# Patient Record
Sex: Male | Born: 1970 | Race: White | Hispanic: No | Marital: Married | Smoking: Former smoker
Health system: Southern US, Community
[De-identification: ages and names within clinical notes are randomized; demographics above are authoritative.]

## PROBLEM LIST (undated history)

## (undated) DIAGNOSIS — K219 Gastro-esophageal reflux disease without esophagitis: Secondary | ICD-10-CM

## (undated) DIAGNOSIS — S83512A Sprain of anterior cruciate ligament of left knee, initial encounter: Secondary | ICD-10-CM

## (undated) DIAGNOSIS — T7840XA Allergy, unspecified, initial encounter: Secondary | ICD-10-CM

## (undated) DIAGNOSIS — E785 Hyperlipidemia, unspecified: Secondary | ICD-10-CM

## (undated) HISTORY — DX: Gastro-esophageal reflux disease without esophagitis: K21.9

## (undated) HISTORY — DX: Allergy, unspecified, initial encounter: T78.40XA

## (undated) HISTORY — DX: Hyperlipidemia, unspecified: E78.5

## (undated) HISTORY — PX: WISDOM TOOTH EXTRACTION: SHX21

## (undated) HISTORY — PX: ARTHROSCOPIC REPAIR ACL: SUR80

## (undated) HISTORY — DX: Sprain of anterior cruciate ligament of left knee, initial encounter: S83.512A

---

## 2015-10-04 DIAGNOSIS — S83512D Sprain of anterior cruciate ligament of left knee, subsequent encounter: Secondary | ICD-10-CM | POA: Diagnosis not present

## 2015-10-04 DIAGNOSIS — Z4789 Encounter for other orthopedic aftercare: Secondary | ICD-10-CM | POA: Diagnosis not present

## 2015-10-13 DIAGNOSIS — S83512D Sprain of anterior cruciate ligament of left knee, subsequent encounter: Secondary | ICD-10-CM | POA: Diagnosis not present

## 2015-10-15 DIAGNOSIS — S83512D Sprain of anterior cruciate ligament of left knee, subsequent encounter: Secondary | ICD-10-CM | POA: Diagnosis not present

## 2015-10-19 DIAGNOSIS — S83512D Sprain of anterior cruciate ligament of left knee, subsequent encounter: Secondary | ICD-10-CM | POA: Diagnosis not present

## 2015-10-21 DIAGNOSIS — S83512D Sprain of anterior cruciate ligament of left knee, subsequent encounter: Secondary | ICD-10-CM | POA: Diagnosis not present

## 2015-10-28 DIAGNOSIS — S83512D Sprain of anterior cruciate ligament of left knee, subsequent encounter: Secondary | ICD-10-CM | POA: Diagnosis not present

## 2015-11-01 DIAGNOSIS — S83512D Sprain of anterior cruciate ligament of left knee, subsequent encounter: Secondary | ICD-10-CM | POA: Diagnosis not present

## 2015-11-03 DIAGNOSIS — S83512D Sprain of anterior cruciate ligament of left knee, subsequent encounter: Secondary | ICD-10-CM | POA: Diagnosis not present

## 2015-11-04 ENCOUNTER — Ambulatory Visit (INDEPENDENT_AMBULATORY_CARE_PROVIDER_SITE_OTHER): Payer: BLUE CROSS/BLUE SHIELD | Admitting: Urgent Care

## 2015-11-04 VITALS — BP 122/78 | HR 64 | Temp 98.0°F | Resp 18 | Wt 203.0 lb

## 2015-11-04 DIAGNOSIS — Z23 Encounter for immunization: Secondary | ICD-10-CM

## 2015-11-04 DIAGNOSIS — Z9889 Other specified postprocedural states: Secondary | ICD-10-CM | POA: Diagnosis not present

## 2015-11-04 DIAGNOSIS — Z0000 Encounter for general adult medical examination without abnormal findings: Secondary | ICD-10-CM | POA: Diagnosis not present

## 2015-11-04 LAB — COMPREHENSIVE METABOLIC PANEL
ALK PHOS: 76 U/L (ref 40–115)
ALT: 22 U/L (ref 9–46)
AST: 17 U/L (ref 10–40)
Albumin: 4.2 g/dL (ref 3.6–5.1)
BILIRUBIN TOTAL: 0.7 mg/dL (ref 0.2–1.2)
BUN: 14 mg/dL (ref 7–25)
CO2: 28 mmol/L (ref 20–31)
CREATININE: 0.91 mg/dL (ref 0.60–1.35)
Calcium: 9.2 mg/dL (ref 8.6–10.3)
Chloride: 106 mmol/L (ref 98–110)
Glucose, Bld: 100 mg/dL — ABNORMAL HIGH (ref 65–99)
POTASSIUM: 3.9 mmol/L (ref 3.5–5.3)
SODIUM: 141 mmol/L (ref 135–146)
TOTAL PROTEIN: 7 g/dL (ref 6.1–8.1)

## 2015-11-04 LAB — CBC
HCT: 46.4 % (ref 38.5–50.0)
Hemoglobin: 16.2 g/dL (ref 13.2–17.1)
MCH: 31.3 pg (ref 27.0–33.0)
MCHC: 34.9 g/dL (ref 32.0–36.0)
MCV: 89.6 fL (ref 80.0–100.0)
MPV: 10.5 fL (ref 7.5–12.5)
PLATELETS: 242 10*3/uL (ref 140–400)
RBC: 5.18 MIL/uL (ref 4.20–5.80)
RDW: 14.1 % (ref 11.0–15.0)
WBC: 6.9 10*3/uL (ref 3.8–10.8)

## 2015-11-04 LAB — LIPID PANEL
CHOLESTEROL: 205 mg/dL — AB (ref 125–200)
HDL: 65 mg/dL (ref >=40)
LDL Cholesterol: 125 mg/dL (ref <130)
Total CHOL/HDL Ratio: 3.2 Ratio (ref <=5.0)
Triglycerides: 73 mg/dL (ref <150)
VLDL: 15 mg/dL (ref <30)

## 2015-11-04 LAB — TSH: TSH: 1.16 m[IU]/L (ref 0.40–4.50)

## 2015-11-04 NOTE — Progress Notes (Signed)
MRN: [id number]  Subjective:   [name] is a 45 y.o. male presenting for annual physical exam.  Medical care team includes: PCP: No PCP Per Patient Vision: Is currently awaiting his new prescription glasses. Dental: Dental cleanings yearly. Specialists: Physical therapy and Dr. Theda Sers at [facility] for left ACL reconstruction. Sees a dermatologist regularly for monitoring of moles.    Patient is married, does not have any children. 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 Denies smoking cigarettes, has 1 drink of alcohol with dinner daily.   Alejandro Miller does not have any active problems on his problem list.   Alejandro Miller currently has no medications in their medication list. He has No Known Allergies.  Alejandro Miller  has a past medical history of Left ACL tear and GERD (gastroesophageal reflux disease). Also  has past surgical history that includes Arthroscopic repair ACL.  His family history includes Cancer in his maternal grandfather; Heart disease in his father and paternal grandfather; Hypertension in his father.  Immunizations: Last TDAP was in 2004.  Review of Systems  Constitutional: Negative for fever, chills, weight loss, malaise/fatigue and diaphoresis.  HENT: Negative for congestion, ear discharge, ear pain, hearing loss, nosebleeds, sore throat and tinnitus.   Eyes: Negative for blurred vision, double vision, photophobia, pain, discharge and redness.  Respiratory: Negative for cough, shortness of breath and wheezing.   Cardiovascular: Negative for chest pain, palpitations and leg swelling.  Gastrointestinal: Negative for nausea, vomiting, abdominal pain, diarrhea, constipation and blood in stool.  Genitourinary: Negative for dysuria, urgency, frequency, hematuria and flank pain.  Musculoskeletal: Negative for myalgias, back pain and joint pain.  Skin: Negative for itching and rash.  Neurological: Negative for dizziness, tingling,  seizures, loss of consciousness, weakness and headaches.  Endo/Heme/Allergies: Negative for polydipsia.  Psychiatric/Behavioral: Negative for depression, suicidal ideas, hallucinations, memory loss and substance abuse. The patient is not nervous/anxious and does not have insomnia.      Objective:   Vitals: BP 122/78 mmHg  Pulse 64  Temp(Src) 98 F (36.7 C) (Oral)  Resp 18  Wt 203 lb (92.08 kg)  SpO2 97%  Physical Exam  Constitutional: He is oriented to person, place, and time. He appears well-developed and well-nourished.  HENT:  TM's intact bilaterally, no effusions or erythema. Nasal turbinates pink and moist, nasal passages patent. No sinus tenderness. Oropharynx clear, mucous membranes moist, dentition in good repair.  Eyes: Conjunctivae and EOM are normal. Pupils are equal, round, and reactive to light. Right eye exhibits no discharge. Left eye exhibits no discharge. No scleral icterus.  Neck: Normal range of motion. Neck supple. No thyromegaly present.  Cardiovascular: Normal rate, regular rhythm and intact distal pulses.  Exam reveals no gallop and no friction rub.   No murmur heard. Pulmonary/Chest: No stridor. No respiratory distress. He has no wheezes. He has no rales.  Abdominal: Soft. Bowel sounds are normal. He exhibits no distension and no mass. There is no tenderness.  Musculoskeletal: Normal range of motion. He exhibits no edema or tenderness.       Legs: Lymphadenopathy:    He has no cervical adenopathy.  Neurological: He is alert and oriented to person, place, and time. He has normal reflexes.  Skin: Skin is warm and dry. No rash noted. No erythema. No pallor.  Psychiatric: He has a normal mood and affect.   Assessment and Plan :   1. Annual physical exam - Patient is medically healthy, labs pending. Discussed healthy lifestyle,  diet, exercise, preventative care, vaccinations, and addressed patient's concerns.   2. Need for Tdap vaccination - Tdap vaccine  greater than or equal to 7yo IM  3. S/P ACL repair - Stable, continue follow up with [place], [facility] [phone] 11/04/2015  9:56 AM

## 2015-11-04 NOTE — Patient Instructions (Addendum)
Keeping you healthy  Get these tests  Blood pressure- Have your blood pressure checked once a year by your healthcare provider.  Normal blood pressure is 120/80.  Weight- Have your body mass index (BMI) calculated to screen for obesity.  BMI is a measure of body fat based on height and weight. You can also calculate your own BMI at GravelBags.it.  Cholesterol- Have your cholesterol checked regularly starting at age 46, sooner may be necessary if you have diabetes, high blood pressure, if a family member developed heart diseases at an early age or if you smoke.   Chlamydia, HIV, and other sexual transmitted disease- Get screened each year until the age of 67 then within three months of each new sexual partner.  Diabetes- Have your blood sugar checked regularly if you have high blood pressure, high cholesterol, a family history of diabetes or if you are overweight.  Get these vaccines  Flu shot- Every fall.  Tetanus shot- Every 10 years.  Menactra- Single dose; prevents meningitis.  Take these steps  Don't smoke- If you do smoke, ask your healthcare provider about quitting. For tips on how to quit, go to www.smokefree.gov or call 1-800-QUIT-NOW.  Be physically active- Exercise 5 days a week for at least 30 minutes.  If you are not already physically active start slow and gradually work up to 30 minutes of moderate physical activity.  Examples of moderate activity include walking briskly, mowing the yard, dancing, swimming bicycling, etc.  Eat a healthy diet- Eat a variety of healthy foods such as fruits, vegetables, low fat milk, low fat cheese, yogurt, lean meats, poultry, fish, beans, tofu, etc.  For more information on healthy eating, go to www.thenutritionsource.org  Drink alcohol in moderation- Limit alcohol intake two drinks or less a day.  Never drink and drive.  Dentist- Brush and floss teeth twice daily; visit your dentis twice a year.  Depression-Your emotional  health is as important as your physical health.  If you're feeling down, losing interest in things you normally enjoy please talk with your healthcare provider.  Gun Safety- If you keep a gun in your home, keep it unloaded and with the safety lock on.  Bullets should be stored separately.  Helmet use- Always wear a helmet when riding a motorcycle, bicycle, rollerblading or skateboarding.  Safe sex- If you may be exposed to a sexually transmitted infection, use a condom  Seat belts- Seat bels can save your life; always wear one.  Smoke/Carbon Monoxide detectors- These detectors need to be installed on the appropriate level of your home.  Replace batteries at least once a year.  Skin Cancer- When out in the sun, cover up and use sunscreen SPF 15 or higher.  Violence- If anyone is threatening or hurting you, please tell your healthcare provider.    IF you received an x-ray today, you will receive an invoice from [facility]. Please contact [facility] at [phone] with questions or concerns regarding your invoice.   IF you received labwork today, you will receive an invoice from [employer]. Please contact [facility] at [phone] with questions or concerns regarding your invoice.   Our billing staff will not be able to assist you with questions regarding bills from these companies.  You will be contacted with the lab results as soon as they are available. The fastest way to get your results is to activate your My Chart account. Instructions are located on the last page of this paperwork. If you have  not heard from [place] regarding the results in 2 weeks, please contact this office.

## 2015-11-05 DIAGNOSIS — S83512D Sprain of anterior cruciate ligament of left knee, subsequent encounter: Secondary | ICD-10-CM | POA: Diagnosis not present

## 2015-11-09 ENCOUNTER — Encounter: Payer: Self-pay | Admitting: Urgent Care

## 2015-11-22 DIAGNOSIS — M1712 Unilateral primary osteoarthritis, left knee: Secondary | ICD-10-CM | POA: Diagnosis not present

## 2015-11-22 DIAGNOSIS — S83512D Sprain of anterior cruciate ligament of left knee, subsequent encounter: Secondary | ICD-10-CM | POA: Diagnosis not present

## 2015-11-23 DIAGNOSIS — S83512D Sprain of anterior cruciate ligament of left knee, subsequent encounter: Secondary | ICD-10-CM | POA: Diagnosis not present

## 2015-11-25 DIAGNOSIS — S83512D Sprain of anterior cruciate ligament of left knee, subsequent encounter: Secondary | ICD-10-CM | POA: Diagnosis not present

## 2015-11-30 DIAGNOSIS — S83512D Sprain of anterior cruciate ligament of left knee, subsequent encounter: Secondary | ICD-10-CM | POA: Diagnosis not present

## 2015-12-02 DIAGNOSIS — S83512D Sprain of anterior cruciate ligament of left knee, subsequent encounter: Secondary | ICD-10-CM | POA: Diagnosis not present

## 2016-01-04 DIAGNOSIS — M1712 Unilateral primary osteoarthritis, left knee: Secondary | ICD-10-CM | POA: Diagnosis not present

## 2016-01-04 DIAGNOSIS — S83512D Sprain of anterior cruciate ligament of left knee, subsequent encounter: Secondary | ICD-10-CM | POA: Diagnosis not present

## 2016-01-04 DIAGNOSIS — Z4789 Encounter for other orthopedic aftercare: Secondary | ICD-10-CM | POA: Diagnosis not present

## 2016-01-19 DIAGNOSIS — S83512D Sprain of anterior cruciate ligament of left knee, subsequent encounter: Secondary | ICD-10-CM | POA: Diagnosis not present

## 2016-01-24 DIAGNOSIS — S83512D Sprain of anterior cruciate ligament of left knee, subsequent encounter: Secondary | ICD-10-CM | POA: Diagnosis not present

## 2016-01-24 DIAGNOSIS — Z4789 Encounter for other orthopedic aftercare: Secondary | ICD-10-CM | POA: Diagnosis not present

## 2016-01-24 DIAGNOSIS — M25862 Other specified joint disorders, left knee: Secondary | ICD-10-CM | POA: Diagnosis not present

## 2016-01-24 DIAGNOSIS — M1712 Unilateral primary osteoarthritis, left knee: Secondary | ICD-10-CM | POA: Diagnosis not present

## 2016-02-03 DIAGNOSIS — M94262 Chondromalacia, left knee: Secondary | ICD-10-CM | POA: Diagnosis not present

## 2016-02-03 DIAGNOSIS — S83242A Other tear of medial meniscus, current injury, left knee, initial encounter: Secondary | ICD-10-CM | POA: Diagnosis not present

## 2016-02-03 DIAGNOSIS — M1712 Unilateral primary osteoarthritis, left knee: Secondary | ICD-10-CM | POA: Diagnosis not present

## 2016-02-03 DIAGNOSIS — S83232A Complex tear of medial meniscus, current injury, left knee, initial encounter: Secondary | ICD-10-CM | POA: Diagnosis not present

## 2016-03-09 ENCOUNTER — Ambulatory Visit (INDEPENDENT_AMBULATORY_CARE_PROVIDER_SITE_OTHER): Payer: BLUE CROSS/BLUE SHIELD | Admitting: Family Medicine

## 2016-03-09 VITALS — BP 140/88 | HR 68 | Temp 98.2°F | Resp 18 | Wt 207.8 lb

## 2016-03-09 DIAGNOSIS — R300 Dysuria: Secondary | ICD-10-CM | POA: Diagnosis not present

## 2016-03-09 LAB — POCT URINALYSIS DIP (MANUAL ENTRY)
Bilirubin, UA: NEGATIVE
GLUCOSE UA: NEGATIVE
Ketones, POC UA: NEGATIVE
LEUKOCYTES UA: NEGATIVE
NITRITE UA: NEGATIVE
Protein Ur, POC: NEGATIVE
SPEC GRAV UA: 1.025
UROBILINOGEN UA: 0.2
pH, UA: 6

## 2016-03-09 LAB — POC MICROSCOPIC URINALYSIS (UMFC): MUCUS RE: ABSENT

## 2016-03-09 MED ORDER — CIPROFLOXACIN HCL 500 MG PO TABS: 500.0000 mg | ORAL_TABLET | Freq: Two times a day (BID) | ORAL | 0 refills | Status: DC

## 2016-03-09 MED ORDER — HYDROCORTISONE 2.5 % RE CREA: 1.0000 "application " | TOPICAL_CREAM | Freq: Two times a day (BID) | RECTAL | 0 refills | Status: DC

## 2016-03-09 NOTE — Progress Notes (Signed)
   Alejandro Miller is a 45 y.o. male who presents to [facility] today for urinary symptoms:  1.  LUTS:  77 rolled male with symptoms of nocturia, and suprapubic pain, difficulty stopping urine stream for past 10 days. Denies any fevers or chills. Denies any concern for STDs. No actual dysuria. States prior to 10 days ago with initial course of health.  Nocturia x 3-4 at night.    2.  Anal fissure:  History of this in the past. Worsened with constipation. States he is having a bowel movement which is soft and normal daily. However has had increased itching and irritation with wiping. He is switched to wet wipes. Has not tried anything else for relief. Only minimal blood when wiping occasionally. No blood in the toilet bowl or on stool.  ROS as above.   PMH reviewed. Patient is a nonsmoker.   Past Medical History:  Diagnosis Date  . GERD (gastroesophageal reflux disease)   . Left ACL tear    Past Surgical History:  Procedure Laterality Date  . ARTHROSCOPIC REPAIR ACL      Medications reviewed. Current Outpatient Prescriptions  Medication Sig Dispense Refill  . aspirin EC 81 MG tablet Take 81 mg by mouth daily.    Marland Kitchen HYDROcodone-acetaminophen (NORCO/VICODIN) 5-325 MG tablet Take 1 tablet by mouth every 6 (six) hours as needed for moderate pain.     No current facility-administered medications for this visit.      Physical Exam:  BP 140/88 (BP Location: Right Arm, Cuff Size: Normal)   Pulse 68   Temp 98.2 F (36.8 C) (Oral)   Resp 18   Wt 207 lb 12.8 oz (94.3 kg)   SpO2 97%  Gen:  Alert, cooperative patient who appears stated age in no acute distress.  Vital signs reviewed. HEENT: EOMI,  MMM Pulm:  Clear to auscultation bilaterally with good air movement.  No wheezes or rales noted.   Cardiac:  Regular rate and rhythm without murmur auscultated.  Good S1/S2. Abd:  Soft/nondistended/nontender.   Rectal:  Anal fissure noted at 6:00 region of rectum. Good rectal  tone. Stool in vault. Somewhat but only mildly tender to prostate exam. Minimally enlarged. Regular and symmetric. No nodules. No blood on glove. GU: Circumcised male. No swelling noted. No hernias palpated bilaterally. No scrotal swelling or redness. No penile lesions.  Assessment and Plan:  1.  UTI male: -Plan to treat with Cipro twice a day 10 days. -Do not think this is acute prostatitis due to lack of systemic symptoms or rectal pain. Also minimal tenderness on rectal exam. -He did have blood noted in UA. -Needs to return in 2-3 weeks after his finishes antibiotics for recheck of urinalysis to ensure blood is clear. If not may warrant referral to urology at that point.  #2. Anal fissures: -Anusol to treat. Restart stool softeners if constipation recurs.

## 2016-03-09 NOTE — Patient Instructions (Addendum)
Take the ciprofloxacin which is an antibiotic twice daily for the next 10 days.  I do note the anal fissure. You can use hydrocortisone cream to relieve this. I sent this.    Come back in about 2 weeks after you finish the antibiotics. At that point. your symptoms should have resolved. You will need a repeat urinalysis to ensure that all of the blood has cleared. If not you may warrant referral to urology.

## 2016-03-11 LAB — GC/CHLAMYDIA PROBE AMP
CT Probe RNA: NOT DETECTED
GC PROBE AMP APTIMA: NOT DETECTED

## 2016-03-11 LAB — URINE CULTURE: Organism ID, Bacteria: NO GROWTH

## 2016-05-23 DIAGNOSIS — L720 Epidermal cyst: Secondary | ICD-10-CM | POA: Diagnosis not present

## 2016-05-23 DIAGNOSIS — D225 Melanocytic nevi of trunk: Secondary | ICD-10-CM | POA: Diagnosis not present

## 2016-05-23 DIAGNOSIS — D2361 Other benign neoplasm of skin of right upper limb, including shoulder: Secondary | ICD-10-CM | POA: Diagnosis not present

## 2016-05-23 DIAGNOSIS — L700 Acne vulgaris: Secondary | ICD-10-CM | POA: Diagnosis not present

## 2016-11-07 ENCOUNTER — Ambulatory Visit (INDEPENDENT_AMBULATORY_CARE_PROVIDER_SITE_OTHER): Payer: BLUE CROSS/BLUE SHIELD | Admitting: Urgent Care

## 2016-11-07 ENCOUNTER — Encounter: Payer: Self-pay | Admitting: Urgent Care

## 2016-11-07 VITALS — BP 137/87 | HR 60 | Temp 98.6°F | Resp 18 | Ht 66.93 in | Wt 212.0 lb

## 2016-11-07 DIAGNOSIS — Z0000 Encounter for general adult medical examination without abnormal findings: Secondary | ICD-10-CM | POA: Diagnosis not present

## 2016-11-07 DIAGNOSIS — M25474 Effusion, right foot: Secondary | ICD-10-CM | POA: Diagnosis not present

## 2016-11-07 DIAGNOSIS — E669 Obesity, unspecified: Secondary | ICD-10-CM | POA: Diagnosis not present

## 2016-11-07 DIAGNOSIS — M79674 Pain in right toe(s): Secondary | ICD-10-CM | POA: Diagnosis not present

## 2016-11-07 DIAGNOSIS — Z6833 Body mass index (BMI) 33.0-33.9, adult: Secondary | ICD-10-CM

## 2016-11-07 NOTE — Progress Notes (Signed)
   Subjective:    Patient ID: Alejandro Miller, male    DOB: December 16, 1970, 46 y.o.   MRN: [id number]  HPI    Review of Systems  Constitutional: Negative.   HENT: Negative.   Eyes: Negative.   Respiratory: Negative.   Cardiovascular: Negative.   Gastrointestinal: Negative.   Endocrine: Negative.   Genitourinary: Negative.   Musculoskeletal: Negative.   Skin: Negative.   Allergic/Immunologic: Negative.   Neurological: Negative.   Hematological: Negative.   Psychiatric/Behavioral: Negative.        Objective:   Physical Exam        Assessment & Plan:

## 2016-11-07 NOTE — Patient Instructions (Signed)

## 2016-11-07 NOTE — Progress Notes (Signed)
MRN: [id number]  Subjective:   [name] is a 46 y.o. male presenting for annual physical exam. Patient is married, does not have any children. 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 Has not been able to exercise as much this year due to recovering from his ACL repair. Denies smoking cigarettes, has 1 drink of alcohol with dinner daily.   Medical care team includes: PCP: Patient, No Pcp Per Vision: Wears prescription eye glasses, has eye exams yearly. Dental: Cleanings once yearly. Specialists: Dermatology to monitor moles. Has had several biopsies and all have been negative for malignancy.  Health Maintenance: Tdap updated on 11/04/2015  Alejandro Miller is not currently taking any medications. He is allergic to penicillins and silvadene [silver sulfadiazine].  Alejandro Miller  has a past medical history of GERD (gastroesophageal reflux disease) and Left ACL tear. Also  has a past surgical history that includes Arthroscopic repair ACL. His family history includes Cancer in his maternal grandfather; Heart disease in his father and paternal grandfather; Hypertension in his father.  Review of Systems  Constitutional: Negative for chills, diaphoresis, fever, malaise/fatigue and weight loss.  HENT: Negative for congestion, ear discharge, ear pain, hearing loss, nosebleeds, sore throat and tinnitus.   Eyes: Negative for blurred vision, double vision, photophobia, pain, discharge and redness.  Respiratory: Negative for cough, shortness of breath and wheezing.   Cardiovascular: Negative for chest pain, palpitations and leg swelling.  Gastrointestinal: Negative for abdominal pain, blood in stool, constipation, diarrhea, nausea and vomiting.  Genitourinary: Negative for dysuria, flank pain, frequency, hematuria and urgency.  Musculoskeletal: Positive for joint pain (1st right MTP pain for 1 week with associated rigidity and warmth; eats a lot of meat and dairy). Negative for back  pain and myalgias.  Skin: Negative for itching and rash.  Neurological: Negative for dizziness, tingling, seizures, loss of consciousness, weakness and headaches.  Endo/Heme/Allergies: Negative for polydipsia.  Psychiatric/Behavioral: Negative for depression, hallucinations, memory loss, substance abuse and suicidal ideas. The patient is not nervous/anxious and does not have insomnia.    Objective:   Vitals: BP 137/87   Pulse 60   Temp 98.6 F (37 C) (Oral)   Resp 18   Ht 5' 6.93" (1.7 m)   Wt 212 lb (96.2 kg)   SpO2 97%   BMI 33.27 kg/m   Physical Exam  Constitutional: He is oriented to person, place, and time. He appears well-developed and well-nourished.  HENT:  TM's intact bilaterally, no effusions or erythema. Nasal turbinates pink and moist, nasal passages patent. No sinus tenderness. Oropharynx clear, mucous membranes moist, dentition in good repair.  Eyes: Conjunctivae and EOM are normal. Pupils are equal, round, and reactive to light. Right eye exhibits no discharge. Left eye exhibits no discharge. No scleral icterus.  Neck: Normal range of motion. Neck supple. No thyromegaly present.  Cardiovascular: Normal rate, regular rhythm and intact distal pulses.  Exam reveals no gallop and no friction rub.   No murmur heard. Pulmonary/Chest: No stridor. No respiratory distress. He has no wheezes. He has no rales.  Abdominal: Soft. Bowel sounds are normal. He exhibits no distension and no mass. There is no tenderness.  Musculoskeletal: Normal range of motion. He exhibits no edema.       Right foot: There is tenderness (1st MTP with associated warmth but no erythema). There is normal range of motion, no bony tenderness, no swelling (trace over 1st MTP), normal capillary refill, no crepitus, no deformity and no laceration.  Lymphadenopathy:  He has no cervical adenopathy.  Neurological: He is alert and oriented to person, place, and time. He has normal reflexes.  Skin: Skin is  warm and dry. No rash noted. No erythema. No pallor.  Psychiatric: He has a normal mood and affect.    Assessment and Plan :   1. Annual physical exam 2. Class 1 obesity without serious comorbidity with body mass index (BMI) of 33.0 to 33.9 in adult, unspecified obesity type - Labs pending. Discussed healthy lifestyle, diet, exercise, preventative care, vaccinations, and addressed patient's concerns.  3. Swelling of first metatarsophalangeal (MTP) joint of right foot 4. Great toe pain, right - Labs pending. Differential includes sprain, inflammation secondary to over use and over-compensating while recovering from his left ACL repair.  Jaynee Eagles, PA-C [facility] [phone] 11/07/2016  3:23 PM

## 2016-11-08 LAB — COMPREHENSIVE METABOLIC PANEL
A/G RATIO: 1.6 (ref 1.2–2.2)
ALK PHOS: 94 IU/L (ref 39–117)
ALT: 33 IU/L (ref 0–44)
AST: 25 IU/L (ref 0–40)
Albumin: 4.2 g/dL (ref 3.5–5.5)
BILIRUBIN TOTAL: 0.6 mg/dL (ref 0.0–1.2)
BUN/Creatinine Ratio: 12 (ref 9–20)
BUN: 11 mg/dL (ref 6–24)
CHLORIDE: 99 mmol/L (ref 96–106)
CO2: 22 mmol/L (ref 18–29)
Calcium: 8.9 mg/dL (ref 8.7–10.2)
Creatinine, Ser: 0.94 mg/dL (ref 0.76–1.27)
GFR calc Af Amer: 111 mL/min/{1.73_m2} (ref >59)
GFR calc non Af Amer: 96 mL/min/{1.73_m2} (ref >59)
Globulin, Total: 2.6 g/dL (ref 1.5–4.5)
Glucose: 84 mg/dL (ref 65–99)
POTASSIUM: 3.5 mmol/L (ref 3.5–5.2)
Sodium: 137 mmol/L (ref 134–144)
Total Protein: 6.8 g/dL (ref 6.0–8.5)

## 2016-11-08 LAB — CBC
HEMATOCRIT: 46 % (ref 37.5–51.0)
HEMOGLOBIN: 16 g/dL (ref 13.0–17.7)
MCH: 31.3 pg (ref 26.6–33.0)
MCHC: 34.8 g/dL (ref 31.5–35.7)
MCV: 90 fL (ref 79–97)
Platelets: 237 10*3/uL (ref 150–379)
RBC: 5.12 x10E6/uL (ref 4.14–5.80)
RDW: 14.1 % (ref 12.3–15.4)
WBC: 8.7 10*3/uL (ref 3.4–10.8)

## 2016-11-08 LAB — LIPID PANEL
CHOLESTEROL TOTAL: 204 mg/dL — AB (ref 100–199)
Chol/HDL Ratio: 3.2 ratio (ref 0.0–5.0)
HDL: 63 mg/dL (ref >39)
LDL Calculated: 122 mg/dL — ABNORMAL HIGH (ref 0–99)
TRIGLYCERIDES: 96 mg/dL (ref 0–149)
VLDL Cholesterol Cal: 19 mg/dL (ref 5–40)

## 2016-11-08 LAB — SEDIMENTATION RATE: Sed Rate: 4 mm/hr (ref 0–15)

## 2016-11-08 LAB — HEMOGLOBIN A1C
Est. average glucose Bld gHb Est-mCnc: 100 mg/dL
HEMOGLOBIN A1C: 5.1 % (ref 4.8–5.6)

## 2016-11-08 LAB — URIC ACID: Uric Acid: 6.5 mg/dL (ref 3.7–8.6)

## 2016-11-09 ENCOUNTER — Other Ambulatory Visit: Payer: Self-pay | Admitting: Urgent Care

## 2016-11-09 MED ORDER — NAPROXEN SODIUM 550 MG PO TABS: 550.0000 mg | ORAL_TABLET | Freq: Two times a day (BID) | ORAL | 1 refills | Status: DC

## 2017-02-22 ENCOUNTER — Ambulatory Visit (INDEPENDENT_AMBULATORY_CARE_PROVIDER_SITE_OTHER): Payer: BLUE CROSS/BLUE SHIELD | Admitting: Urgent Care

## 2017-02-22 ENCOUNTER — Encounter: Payer: Self-pay | Admitting: Urgent Care

## 2017-02-22 VITALS — BP 125/79 | HR 63 | Temp 98.5°F | Resp 18 | Ht 66.0 in | Wt 214.8 lb

## 2017-02-22 DIAGNOSIS — N644 Mastodynia: Secondary | ICD-10-CM

## 2017-02-22 DIAGNOSIS — Z113 Encounter for screening for infections with a predominantly sexual mode of transmission: Secondary | ICD-10-CM | POA: Diagnosis not present

## 2017-02-22 MED ORDER — DOXYCYCLINE HYCLATE 100 MG PO CAPS: 100.0000 mg | ORAL_CAPSULE | Freq: Two times a day (BID) | ORAL | 0 refills | Status: DC

## 2017-02-22 NOTE — Progress Notes (Signed)
  MRN: [id number] DOB: September 12, 1970  Subjective:   Alejandro Miller is a 46 y.o. male presenting for chief complaint of Breast Pain (x2 weeks; his right breast is painful/swollen; no lumps) and STD check (would like to have blood work done for STD)  Reports 2 week history of right sided breast pain, nipple pain and swelling. Has not tried medications for relief. Denies fever, skin changes, nipple changes, rash, erythema, trauma. Denies smoking cigarettes. Patient is also requesting a standard STI screening.   Devun has a current medication list which includes the following prescription(s): naproxen sodium. Also is allergic to penicillins and silvadene [silver sulfadiazine].  Gen  has a past medical history of GERD (gastroesophageal reflux disease) and Left ACL tear. Also  has a past surgical history that includes Arthroscopic repair ACL.  Objective:   Vitals: BP 125/79   Pulse 63   Temp 98.5 F (36.9 C) (Oral)   Resp 18   Ht 5\' 6"  (1.676 m)   Wt 214 lb 12.8 oz (97.4 kg)   SpO2 96%   BMI 34.67 kg/m   Physical Exam  Constitutional: He is oriented to person, place, and time. He appears well-developed and well-nourished.  Cardiovascular: Normal rate.   Pulmonary/Chest: Effort normal. He exhibits tenderness (mild, over right nipple) and swelling (trace over right nipple). He exhibits no mass, no laceration, no deformity and no retraction. Right breast exhibits tenderness (over right nipple). Right breast exhibits no inverted nipple, no mass, no nipple discharge and no skin change. Left breast exhibits no inverted nipple, no mass, no nipple discharge, no skin change and no tenderness. Breasts are symmetrical.  Lymphadenopathy:    He has no cervical adenopathy.    He has no axillary adenopathy.       Right: No inguinal adenopathy present.       Left: No inguinal adenopathy present.  Neurological: He is alert and oriented to person, place, and time.   Assessment and Plan :   1. Breast pain,  right - Will cover for superficial skin infection. Has penicillin allergy, will start doxycycline. Use cold packs, naproxen. Return-to-clinic precautions discussed, patient verbalized understanding.  - Prolactin - CBC  2. Routine screening for STI (sexually transmitted infection) - Future labs pending as well. - HIV antibody - RPR - Hepatitis C antibody - GC/Chlamydia Probe Amp; Future - Trichomonas vaginalis, RNA; Future   Jaynee Eagles, PA-C [facility] [phone] 02/22/2017  3:13 PM

## 2017-02-22 NOTE — Patient Instructions (Addendum)
Please use naproxen for inflammation, icing for 15 minutes every 2 hours.    IF you received an x-ray today, you will receive an invoice from [facility]. Please contact [facility] at [phone] with questions or concerns regarding your invoice.   IF you received labwork today, you will receive an invoice from [facility]. Please contact [facility] at [phone] with questions or concerns regarding your invoice.   Our billing staff will not be able to assist you with questions regarding bills from these companies.  You will be contacted with the lab results as soon as they are available. The fastest way to get your results is to activate your My Chart account. Instructions are located on the last page of this paperwork. If you have not heard from [place] regarding the results in 2 weeks, please contact this office.

## 2017-02-23 LAB — CBC
HEMOGLOBIN: 15.1 g/dL (ref 13.0–17.7)
Hematocrit: 44.2 % (ref 37.5–51.0)
MCH: 30.9 pg (ref 26.6–33.0)
MCHC: 34.2 g/dL (ref 31.5–35.7)
MCV: 90 fL (ref 79–97)
Platelets: 266 10*3/uL (ref 150–379)
RBC: 4.89 x10E6/uL (ref 4.14–5.80)
RDW: 13.6 % (ref 12.3–15.4)
WBC: 11.6 10*3/uL — ABNORMAL HIGH (ref 3.4–10.8)

## 2017-02-23 LAB — PROLACTIN: PROLACTIN: 7.3 ng/mL (ref 4.0–15.2)

## 2017-02-23 LAB — HEPATITIS C ANTIBODY

## 2017-02-23 LAB — HIV ANTIBODY (ROUTINE TESTING W REFLEX): HIV Screen 4th Generation wRfx: NONREACTIVE

## 2017-02-23 LAB — RPR: RPR Ser Ql: NONREACTIVE

## 2017-02-27 DIAGNOSIS — Z113 Encounter for screening for infections with a predominantly sexual mode of transmission: Secondary | ICD-10-CM | POA: Diagnosis not present

## 2017-02-27 NOTE — Addendum Note (Signed)
Addended by: Gari Crown D on: 02/27/2017 11:29 AM   Modules accepted: Orders

## 2017-02-28 LAB — GC/CHLAMYDIA PROBE AMP
Chlamydia trachomatis, NAA: NEGATIVE
Neisseria gonorrhoeae by PCR: NEGATIVE

## 2017-02-28 LAB — TRICHOMONAS VAGINALIS, PROBE AMP: TRICH VAG BY NAA: NEGATIVE

## 2017-03-13 ENCOUNTER — Encounter: Payer: Self-pay | Admitting: Urgent Care

## 2017-03-13 ENCOUNTER — Ambulatory Visit (INDEPENDENT_AMBULATORY_CARE_PROVIDER_SITE_OTHER): Payer: BLUE CROSS/BLUE SHIELD | Admitting: Urgent Care

## 2017-03-13 ENCOUNTER — Ambulatory Visit (INDEPENDENT_AMBULATORY_CARE_PROVIDER_SITE_OTHER): Payer: BLUE CROSS/BLUE SHIELD

## 2017-03-13 VITALS — BP 126/90 | HR 60 | Temp 98.3°F | Resp 16 | Ht 66.0 in | Wt 214.0 lb

## 2017-03-13 DIAGNOSIS — N644 Mastodynia: Secondary | ICD-10-CM | POA: Diagnosis not present

## 2017-03-13 DIAGNOSIS — J9811 Atelectasis: Secondary | ICD-10-CM | POA: Diagnosis not present

## 2017-03-13 NOTE — Patient Instructions (Addendum)
We will pursue a breast ultrasound. The schedulers will call you and coordinate this with you or let you know when it is scheduled. Thank you for letting me take care of you today!     IF you received an x-ray today, you will receive an invoice from [facility]. Please contact [facility] at [phone] with questions or concerns regarding your invoice.   IF you received labwork today, you will receive an invoice from [place] [facility]. Please contact [facility] at [phone] with questions or concerns regarding your invoice.   Our billing staff will not be able to assist you with questions regarding bills from these companies.  You will be contacted with the lab results as soon as they are available. The fastest way to get your results is to activate your My Chart account. Instructions are located on the last page of this paperwork. If you have not heard from [place] regarding the results in 2 weeks, please contact this office.

## 2017-03-13 NOTE — Progress Notes (Signed)
    MRN: [id number] DOB: 04-23-1971  Subjective:   Alejandro Miller is a 46 y.o. male presenting for follow up on breast pain. Last OV was 02/22/2017 for the same, was started on doxycycline d/t penicillin allergy. Today, reports ongoing right nipple pain and swelling of areola. Denies fever, skin changes, nipple discharge, redness, warmth, bleeding, headaches, dizziness, balance issues, weight loss, decreased appetite, n/v, abdominal pain. Patient has a strong family history of brain cancer.   Alejandro Miller has a current medication list which includes the following prescription(s): doxycycline and naproxen sodium. Also is allergic to penicillins and silvadene [silver sulfadiazine].  Alejandro Miller  has a past medical history of GERD (gastroesophageal reflux disease) and Left ACL tear. Also  has a past surgical history that includes Arthroscopic repair ACL.  Objective:   Vitals: BP 126/90   Pulse 60   Temp 98.3 F (36.8 C) (Oral)   Resp 16   Ht 5\' 6"  (1.676 m)   Wt 214 lb (97.1 kg)   SpO2 97%   BMI 34.54 kg/m   Physical Exam  Constitutional: He is oriented to person, place, and time. He appears well-developed and well-nourished.  Cardiovascular: Normal rate.   Pulmonary/Chest: Effort normal. Right breast exhibits tenderness (directly over nipple). Right breast exhibits no inverted nipple, no mass, no nipple discharge and no skin change. Left breast exhibits no inverted nipple, no mass, no nipple discharge, no skin change and no tenderness. Breasts are asymmetrical (trace edema over areola).  Lymphadenopathy:    He has no cervical adenopathy.       Right cervical: No posterior cervical adenopathy present.      Left cervical: No posterior cervical adenopathy present.    He has no axillary adenopathy.       Right axillary: No pectoral and no lateral adenopathy present.       Left axillary: No pectoral and no lateral adenopathy present.      Right: No inguinal and no supraclavicular adenopathy present.   Left: No inguinal and no supraclavicular adenopathy present.  Neurological: He is alert and oriented to person, place, and time.   Dg Chest 2 View  Result Date: 03/13/2017 CLINICAL DATA:  Breast pain in a male EXAM: CHEST  2 VIEW COMPARISON:  None. FINDINGS: Mild linear scarring is noted at the lung base on the lateral view but no pneumonia or effusion is seen. Mediastinal and hilar contours are unremarkable. The heart is within upper limits normal. No bony abnormality is seen IMPRESSION: Mild basilar linear scarring or atelectasis.  No active process. Electronically Signed   By: Ivar Drape M.D.   On: 03/13/2017 16:06   Assessment and Plan :   This case was precepted with Dr. Mitchel Honour.   1. Breast pain in male - Will pursue right breast US. Patient is in agreement.  Jaynee Eagles, PA-C [facility] [phone] 03/13/2017 3:49 PM

## 2017-03-22 ENCOUNTER — Other Ambulatory Visit: Payer: Self-pay | Admitting: Urgent Care

## 2017-03-22 DIAGNOSIS — N644 Mastodynia: Secondary | ICD-10-CM

## 2017-03-28 ENCOUNTER — Ambulatory Visit
Admission: RE | Admit: 2017-03-28 | Discharge: 2017-03-28 | Disposition: A | Discharge status: Home / Self Care | Payer: BLUE CROSS/BLUE SHIELD | Source: Ambulatory Visit | Attending: Urgent Care | Admitting: Urgent Care

## 2017-03-28 DIAGNOSIS — R928 Other abnormal and inconclusive findings on diagnostic imaging of breast: Secondary | ICD-10-CM | POA: Diagnosis not present

## 2017-03-28 DIAGNOSIS — N6489 Other specified disorders of breast: Secondary | ICD-10-CM | POA: Diagnosis not present

## 2017-03-28 DIAGNOSIS — N644 Mastodynia: Secondary | ICD-10-CM

## 2017-05-23 ENCOUNTER — Encounter: Payer: Self-pay | Admitting: Endocrinology

## 2017-05-23 ENCOUNTER — Ambulatory Visit: Payer: BLUE CROSS/BLUE SHIELD | Admitting: Endocrinology

## 2017-05-23 DIAGNOSIS — N62 Hypertrophy of breast: Secondary | ICD-10-CM | POA: Diagnosis not present

## 2017-05-23 LAB — TSH: TSH: 1.75 u[IU]/mL (ref 0.35–4.50)

## 2017-05-23 LAB — HCG, QUANTITATIVE, PREGNANCY: Quantitative HCG: 0.18 m[IU]/mL

## 2017-05-23 NOTE — Progress Notes (Signed)
Subjective:    Patient ID: Alejandro Miller, male    DOB: Jun 19, 1970, 46 y.o.   MRN: [id number]  HPI Pt is self-referred, for gynecomastia.  Pt was the product of a normal pregnancy and delivery.  he had puberty at the normal age.  He has no biological children, and has never tried to father a child. He has never been diagnosed with hypogonadism.  He denies any h/o infertility.  He has never had liver disease, kidney disease, cancer, cystic fibrosis, ulcerative colitis, or alcoholism. Her has never taken cimetidine, calcium channel blockers, growth hormone, risperidone, hCG, opioids, androgens, 5-alpha-reductase inhibitors, cancer chemotherapy, estrogens, or ketoconazole.  He has 5 mos of moderate swelling at the right breast, and assoc pain.  Past Medical History:  Diagnosis Date  . GERD (gastroesophageal reflux disease)   . Left ACL tear     Past Surgical History:  Procedure Laterality Date  . ARTHROSCOPIC REPAIR ACL      Social History   Socioeconomic History  . Marital status: Married    Spouse name: Not on file  . Number of children: Not on file  . Years of education: Not on file  . Highest education level: Not on file  Social Needs  . Financial resource strain: Not on file  . Food insecurity - worry: Not on file  . Food insecurity - inability: Not on file  . Transportation needs - medical: Not on file  . Transportation needs - non-medical: Not on file  Occupational History  . Not on file  Tobacco Use  . Smoking status: Never Smoker  . Smokeless tobacco: Never Used  Substance and Sexual Activity  . Alcohol use: Not on file  . Drug use: Not on file  . Sexual activity: Not on file  Other Topics Concern  . Not on file  Social History Narrative  . Not on file    No current outpatient medications on file prior to visit.   No current facility-administered medications on file prior to visit.     Allergies  Allergen Reactions  . Penicillins Rash  . Silvadene [Silver  Sulfadiazine] Rash    Family History  Problem Relation Age of Onset  . Heart disease Father   . Hypertension Father   . Cancer Maternal Grandfather   . Heart disease Paternal Grandfather   . Other Cousin        gynecomastia    BP (!) 136/94 (BP Location: Left Arm, Patient Position: Sitting, Cuff Size: Normal)   Pulse 69   Wt 213 lb 3.2 oz (96.7 kg)   SpO2 96%   BMI 34.41 kg/m    Review of Systems denies depression, numbness, erectile dysfunction, decreased urinary stream, muscle weakness, fever, headache, easy bruising, sob, rash, blurry vision, rhinorrhea, chest pain.  He has weight gain.     Objective:   Physical Exam VS: see vs page GEN: no distress HEAD: head: no deformity eyes: no periorbital swelling, no proptosis external nose and ears are normal mouth: no lesion seen NECK: supple, thyroid is not enlarged CHEST WALL: no deformity LUNGS: clear to auscultation BREASTS:  Slight bilat gynecomastia, (R>L).  CV: reg rate and rhythm, no murmur ABD: abdomen is soft, nontender.  no hepatosplenomegaly.  not distended.  no hernia GENITALIA:  Normal male.   MUSCULOSKELETAL: muscle bulk and strength are grossly normal.  no obvious joint swelling.  gait is normal and steady EXTEMITIES: no leg edema PULSES: no carotid bruit NEURO:  cn 2-12 grossly intact.  readily moves all 4's.  sensation is intact to touch on all 4's.  SKIN:  Normal texture and temperature.  No rash or suspicious lesion is visible.   NODES:  None palpable at the neck PSYCH: alert, well-oriented.  Does not appear anxious nor depressed.   [place]: Mammography: Asymmetric right breast gynecomastia.  I have reviewed outside records, and summarized: Pt was noted to have gynecomastia.  He was given a trial of doxycycline for poss mastitis, but it did not help.     Assessment & Plan:  Gynecomastia, new, benign. I offered to rx tamoxifen.  He says he'll think it over.   Patient Instructions  blood tests are  requested for you today.  We'll let you know about the results. Let me know if you want medication for this.  It is a cheap generic.  Weight loss also helps these symptoms. I would be happy to see you back here as needed.

## 2017-05-23 NOTE — Patient Instructions (Addendum)
blood tests are requested for you today.  We'll let you know about the results. Let me know if you want medication for this.  It is a cheap generic.  Weight loss also helps these symptoms. I would be happy to see you back here as needed.

## 2017-05-25 LAB — TESTOSTERONE,FREE AND TOTAL
Testosterone, Free: 10.3 pg/mL (ref 6.8–21.5)
Testosterone: 499 ng/dL (ref 264–916)

## 2017-05-30 LAB — ESTRADIOL, FREE
ESTRADIOL FREE: 0.89 pg/mL — AB
ESTRADIOL: 52 pg/mL — AB

## 2017-09-18 DIAGNOSIS — L858 Other specified epidermal thickening: Secondary | ICD-10-CM | POA: Diagnosis not present

## 2017-09-18 DIAGNOSIS — D2262 Melanocytic nevi of left upper limb, including shoulder: Secondary | ICD-10-CM | POA: Diagnosis not present

## 2017-09-18 DIAGNOSIS — D225 Melanocytic nevi of trunk: Secondary | ICD-10-CM | POA: Diagnosis not present

## 2017-09-18 DIAGNOSIS — D2271 Melanocytic nevi of right lower limb, including hip: Secondary | ICD-10-CM | POA: Diagnosis not present

## 2017-11-13 ENCOUNTER — Encounter: Payer: Self-pay | Admitting: Urgent Care

## 2017-11-13 ENCOUNTER — Ambulatory Visit (INDEPENDENT_AMBULATORY_CARE_PROVIDER_SITE_OTHER): Payer: BLUE CROSS/BLUE SHIELD | Admitting: Urgent Care

## 2017-11-13 VITALS — BP 122/80 | HR 94 | Temp 98.1°F | Resp 17 | Ht 68.5 in | Wt 212.0 lb

## 2017-11-13 DIAGNOSIS — G4700 Insomnia, unspecified: Secondary | ICD-10-CM | POA: Diagnosis not present

## 2017-11-13 DIAGNOSIS — Z1329 Encounter for screening for other suspected endocrine disorder: Secondary | ICD-10-CM | POA: Diagnosis not present

## 2017-11-13 DIAGNOSIS — Z634 Disappearance and death of family member: Secondary | ICD-10-CM

## 2017-11-13 DIAGNOSIS — Z113 Encounter for screening for infections with a predominantly sexual mode of transmission: Secondary | ICD-10-CM | POA: Diagnosis not present

## 2017-11-13 DIAGNOSIS — Z6379 Other stressful life events affecting family and household: Secondary | ICD-10-CM | POA: Diagnosis not present

## 2017-11-13 DIAGNOSIS — Z130 Encounter for screening for diseases of the blood and blood-forming organs and certain disorders involving the immune mechanism: Secondary | ICD-10-CM | POA: Diagnosis not present

## 2017-11-13 DIAGNOSIS — N644 Mastodynia: Secondary | ICD-10-CM

## 2017-11-13 DIAGNOSIS — N529 Male erectile dysfunction, unspecified: Secondary | ICD-10-CM | POA: Diagnosis not present

## 2017-11-13 DIAGNOSIS — Z13228 Encounter for screening for other metabolic disorders: Secondary | ICD-10-CM

## 2017-11-13 DIAGNOSIS — Z0000 Encounter for general adult medical examination without abnormal findings: Secondary | ICD-10-CM

## 2017-11-13 DIAGNOSIS — Z13220 Encounter for screening for lipoid disorders: Secondary | ICD-10-CM

## 2017-11-13 MED ORDER — SILDENAFIL CITRATE 20 MG PO TABS: 20.0000 mg | ORAL_TABLET | Freq: Three times a day (TID) | ORAL | 5 refills | Status: DC

## 2017-11-13 NOTE — Progress Notes (Signed)
Per provider, pharmacy called. Verbal given to change sildenafil sig from 20-40mg  three times a day to once daily. Pharmacy verbalizes understanding.

## 2017-11-13 NOTE — Progress Notes (Signed)
MRN: [id number]  Subjective:   [name] is a 47 y.o. male presenting for annual physical exam.  Patient is married, works as a Automotive engineer. Has good relationships at home, has a good support network.  Denies smoking cigarettes has history of 5 drinks of alcohol per week.  I seen patient before for gynecomastia and right male breast pain.  He has been worked up and seen by an endocrinologist who has established the patient needs no further work-up.  Today, patient reports that his symptoms have improved dramatically.  However, he would like to discuss treatment options for erectile dysfunction.  Reports that he has had a lot of difficulty obtaining and maintaining an erection.  Reports that this is getting very frustrating for his relationship with his wife.  Denies any difficulty with this before.  He also is having a hard time of the past year dealing with stress related to family deaths.  He is talking with a therapist and patient wanted to make sure that we documented the difficulties he is having.  Denies SI, HI.  Medical care team includes: PCP: Alejandro Eagles, PA-C Vision: No visual deficits. Dental: Gets dental cleanings twice yearly. Specialists: None at the moment.  Alejandro Miller  is not currently taking any medications.   He is allergic to penicillins and silvadene [silver sulfadiazine]. Alejandro Miller  has a past medical history of GERD (gastroesophageal reflux disease) and Left ACL tear. Also  has a past surgical history that includes Arthroscopic repair ACL.  His family history includes Cancer in his maternal grandfather; Heart disease in his father and paternal grandfather; Hypertension in his father; Other in his cousin.  Immunizations: Last Tdap was 11/04/2015.  Review of Systems  Constitutional: Negative for chills, diaphoresis, fever, malaise/fatigue and weight loss.  HENT: Negative for congestion, ear discharge, ear pain, hearing loss, nosebleeds, sore throat and tinnitus.   Eyes:  Negative for blurred vision, double vision, photophobia, pain, discharge and redness.  Respiratory: Negative for cough, shortness of breath and wheezing.   Cardiovascular: Negative for chest pain, palpitations and leg swelling.  Gastrointestinal: Negative for abdominal pain, blood in stool, constipation, diarrhea, nausea and vomiting.  Genitourinary: Negative for dysuria, flank pain, frequency, hematuria and urgency.       Complains of erectile dysfunction.  Musculoskeletal: Negative for back pain, joint pain and myalgias.  Skin: Negative for itching and rash.  Neurological: Negative for dizziness, tingling, seizures, loss of consciousness, weakness and headaches.  Endo/Heme/Allergies: Negative for polydipsia.  Psychiatric/Behavioral: Positive for depression. Negative for hallucinations, memory loss, substance abuse and suicidal ideas. The patient is nervous/anxious. The patient does not have insomnia.     Objective:   Vitals: BP 122/80   Pulse 94   Temp 98.1 F (36.7 C) (Oral)   Resp 17   Ht 5' 8.5" (1.74 m)   Wt 212 lb (96.2 kg)   SpO2 98%   BMI 31.77 kg/m    Visual Acuity Screening   Right eye Left eye Both eyes  Without correction: 20/20 20/20 20/20   With correction:       Physical Exam  Constitutional: He is oriented to person, place, and time. He appears well-developed and well-nourished.  HENT:  TM's intact bilaterally, no effusions or erythema. Nasal turbinates pink and moist, nasal passages patent. No sinus tenderness. Oropharynx clear, mucous membranes moist, dentition in good repair.  Eyes: Pupils are equal, round, and reactive to light. Conjunctivae and EOM are normal. Right eye exhibits no discharge. Left eye exhibits no  discharge. No scleral icterus.  Neck: Normal range of motion. Neck supple. No thyromegaly present.  Cardiovascular: Normal rate, regular rhythm and intact distal pulses. Exam reveals no gallop and no friction rub.  No murmur  heard. Pulmonary/Chest: No stridor. No respiratory distress. He has no wheezes. He has no rales.  Abdominal: Soft. Bowel sounds are normal. He exhibits no distension and no mass. There is no tenderness. There is no rebound and no guarding.  Musculoskeletal: Normal range of motion. He exhibits no edema or tenderness.  Lymphadenopathy:    He has no cervical adenopathy.  Neurological: He is alert and oriented to person, place, and time. He has normal reflexes. He displays normal reflexes. Coordination normal.  Skin: Skin is warm and dry. No rash noted. No erythema. No pallor.  Psychiatric: He has a normal mood and affect.    Assessment and Plan :   Annual physical exam - Plan: CBC, Comprehensive metabolic panel, Lipid panel, TSH  Routine screening for STI (sexually transmitted infection) - Plan: HIV antibody, RPR, GC/Chlamydia Probe Amp  Breast pain in male  Breast pain, right  Stress due to illness of family member  Death of family member  Insomnia, unspecified type  Erectile dysfunction, unspecified erectile dysfunction type  Patient is very pleasant, medically stable.  Labs pending. Discussed healthy lifestyle, diet, exercise, preventative care, vaccinations, and addressed patient's concerns.  I will have patient start sildenafil for erectile dysfunction today.  He has minimal cardiovascular risks.  Counseled on potential for adverse effects today.  Patient declined medical therapy for depression today.  Offered patient a list of therapist that can help him with his coping.  Alejandro Eagles, PA-C [facility] [phone] 11/13/2017  10:52 AM

## 2017-11-13 NOTE — Patient Instructions (Addendum)
[facility] [address] [place], [place] [address]   Burnard Leigh [phone]  Horton Finer [phone]  Everardo Beals [phone]   [facility] ([address] [place] [employer]) - [phone]  [facility] [place]) - [facility] - [phone]  [facility] [place] - [phone]  [facility]  - [phone] (do not file insurance)       Health Maintenance, Male A healthy lifestyle and preventive care is important for your health and wellness. Ask your health care provider about what schedule of regular examinations is right for you. What should I know about weight and diet? Eat a Healthy Diet  Eat plenty of vegetables, fruits, whole grains, low-fat dairy products, and lean protein.  Do not eat a lot of foods high in solid fats, added sugars, or salt.  Maintain a Healthy Weight Regular exercise can help you achieve or maintain a healthy weight. You should:  Do at least 150 minutes of exercise each week. The exercise should increase your heart rate and make you sweat (moderate-intensity exercise).  Do strength-training exercises at least twice a week.  Watch Your Levels of Cholesterol and Blood Lipids  Have your blood tested for lipids and cholesterol every 5 years starting at 48 years of age. If you are at high risk for heart disease, you should start having your blood tested when you are 48 years old. You may need to have your cholesterol levels checked more often if: ? Your lipid or cholesterol levels are high. ? You are older than 48 years of age. ? You are at high risk for heart disease.  What should I know about cancer screening? Many types of cancers can be detected early and may often be prevented. Lung Cancer  You should be screened every year for lung cancer  if: ? You are a current smoker who has smoked for at least 30 years. ? You are a former smoker who has quit within the past 15 years.  Talk to your health care provider about your screening options, when you should start screening, and how often you should be screened.  Colorectal Cancer  Routine colorectal cancer screening usually begins at 48 years of age and should be repeated every 5-10 years until you are 48 years old. You may need to be screened more often if early forms of precancerous polyps or small growths are found. Your health care provider may recommend screening at an earlier age if you have risk factors for colon cancer.  Your health care provider may recommend using home test kits to check for hidden blood in the stool.  A small camera at the end of a tube can be used to examine your colon (sigmoidoscopy or colonoscopy). This checks for the earliest forms of colorectal cancer.  Prostate and Testicular Cancer  Depending on your age and overall health, your health care provider may do certain tests to screen for prostate and testicular cancer.  Talk to your health care provider about any symptoms or concerns you have about testicular or prostate cancer.  Skin Cancer  Check your skin from head to toe regularly.  Tell your health care provider about any new moles or changes in moles, especially if: ? There is a change in a mole's size, shape, or color. ? You have a mole that is larger than a pencil eraser.  Always use sunscreen. Apply sunscreen liberally and  repeat throughout the day.  Protect yourself by wearing long sleeves, pants, a wide-brimmed hat, and sunglasses when outside.  What should I know about heart disease, diabetes, and high blood pressure?  If you are 34-41 years of age, have your blood pressure checked every 3-5 years. If you are 50 years of age or older, have your blood pressure checked every year. You should have your blood pressure measured  twice-once when you are at a hospital or clinic, and once when you are not at a hospital or clinic. Record the average of the two measurements. To check your blood pressure when you are not at a hospital or clinic, you can use: ? An automated blood pressure machine at a pharmacy. ? A home blood pressure monitor.  Talk to your health care provider about your target blood pressure.  If you are between 24-41 years old, ask your health care provider if you should take aspirin to prevent heart disease.  Have regular diabetes screenings by checking your fasting blood sugar level. ? If you are at a normal weight and have a low risk for diabetes, have this test once every three years after the age of 42. ? If you are overweight and have a high risk for diabetes, consider being tested at a younger age or more often.  A one-time screening for abdominal aortic aneurysm (AAA) by ultrasound is recommended for men aged 8-75 years who are current or former smokers. What should I know about preventing infection? Hepatitis B If you have a higher risk for hepatitis B, you should be screened for this virus. Talk with your health care provider to find out if you are at risk for hepatitis B infection. Hepatitis C Blood testing is recommended for:  Everyone born from [birth year] through 1966.  Anyone with known risk factors for hepatitis C.  Sexually Transmitted Diseases (STDs)  You should be screened each year for STDs including gonorrhea and chlamydia if: ? You are sexually active and are younger than 48 years of age. ? You are older than 48 years of age and your health care provider tells you that you are at risk for this type of infection. ? Your sexual activity has changed since you were last screened and you are at an increased risk for chlamydia or gonorrhea. Ask your health care provider if you are at risk.  Talk with your health care provider about whether you are at high risk of being infected with HIV.  Your health care provider may recommend a prescription medicine to help prevent HIV infection.  What else can I do?  Schedule regular health, dental, and eye exams.  Stay current with your vaccines (immunizations).  Do not use any tobacco products, such as cigarettes, chewing tobacco, and e-cigarettes. If you need help quitting, ask your health care provider.  Limit alcohol intake to no more than 2 drinks per day. One drink equals 12 ounces of beer, 5 ounces of wine, or 1 ounces of hard liquor.  Do not use street drugs.  Do not share needles.  Ask your health care provider for help if you need support or information about quitting drugs.  Tell your health care provider if you often feel depressed.  Tell your health care provider if you have ever been abused or do not feel safe at home. This information is not intended to replace advice given to you by your health care provider. Make sure you discuss any questions you have with your health care  provider. Document Released: 11/25/2007 Document Revised: 01/26/2016 Document Reviewed: 03/02/2015 Elsevier Interactive Patient Education  2018 [employer].    Sildenafil tablets (Revatio) What is this medicine? SILDENAFIL (sil DEN a fil) is used to treat pulmonary arterial hypertension. This is a serious heart and lung condition. This medicine helps to improve symptoms and quality of life. This medicine may be used for other purposes; ask your health care provider or pharmacist if you have questions. COMMON BRAND NAME(S): Revatio What should I tell my health care provider before I take this medicine? They need to know if you have any of these conditions: -anatomical deformation of the penis, Peyronie's disease, or history of priapism (painful and prolonged erection) -bleeding disorders -eye disease, vision problems -heart disease -high or low blood pressure -history of blood diseases, like sickle cell anemia or leukemia -kidney  disease -liver disease -pulmonary veno-occlusive disease (PVOD) -stomach ulcer -an unusual or allergic reaction to sildenafil, other medicines, foods, dyes, or preservatives -pregnant or trying to get pregnant -breast-feeding How should I use this medicine? Take this medicine by mouth with a glass of water. Follow the directions on the prescription label. You can take it with or without food. If it upsets your stomach, take it with food. Take your doses at regular intervals about 4 to 6 hours apart. Do not take it more often than directed. Do not stop taking except on your doctor's advice. Talk to your pediatrician regarding the use of this medicine in children. This medicine is not approved for use in children. Overdosage: If you think you have taken too much of this medicine contact a poison control center or emergency room at once. NOTE: This medicine is only for you. Do not share this medicine with others. What if I miss a dose? If you miss a dose, take it as soon as you can. If it is almost time for your next dose, take only that dose. Do not take double or extra doses. What may interact with this medicine? Do not take this medicine with any of the following medications: -cisapride -cobicistat -nitrates like amyl nitrite, isosorbide dinitrate, isosorbide mononitrate, nitroglycerin -riociguat -telaprevir This medicine may also interact with the following medications: -antiviral medicines for HIV or AIDS -bosentan -certain medicines for benign prostatic hyperplasia (BPH) -certain medicines for blood pressure -certain medicines for fungal infections like ketoconazole and itraconazole -cimetidine -erythromycin -rifampin This list may not describe all possible interactions. Give your health care provider a list of all the medicines, herbs, non-prescription drugs, or dietary supplements you use. Also tell them if you smoke, drink alcohol, or use illegal drugs. Some items may interact  with your medicine. What should I watch for while using this medicine? Tell your doctor or healthcare professional if your symptoms do not start to get better or if they get worse. Tell your doctor or health care professional right away if you have any change in your eyesight or hearing. You may get dizzy. Do not drive, use machinery, or do anything that needs mental alertness until you know how this medicine affects you. Do not stand or sit up quickly, especially if you are an older patient. This reduces the risk of dizzy or fainting spells. Avoid alcoholic drinks; they can make you more dizzy. What side effects may I notice from receiving this medicine? Side effects that you should report to your doctor or health care professional as soon as possible: -allergic reactions like skin rash, itching or hives, swelling of the face, lips, or  tongue -breathing problems -changes in vision -chest pain -decreased hearing -fast, irregular heartbeat -men: prolonged or painful erection (lasting more than 4 hours) Side effects that usually do not require medical attention (report to your doctor or health care professional if they continue or are bothersome): -facial flushing -headache -nosebleed -trouble sleeping -upset stomach This list may not describe all possible side effects. Call your doctor for medical advice about side effects. You may report side effects to FDA at 1-800-FDA-1088. Where should I keep my medicine? Keep out of reach of children. Store at room temperature between 15 and 30 degrees C (59 and 86 degrees F). Throw away any unused medicine after the expiration date. NOTE: This sheet is a summary. It may not cover all possible information. If you have questions about this medicine, talk to your doctor, pharmacist, or health care provider.  2018 Elsevier/Gold Standard (2015-05-12 17:18:06)     IF you received an x-ray today, you will receive an invoice from [facility].  Please contact [facility] at [phone] with questions or concerns regarding your invoice.   IF you received labwork today, you will receive an invoice from [facility]. Please contact [facility] at [phone] with questions or concerns regarding your invoice.   Our billing staff will not be able to assist you with questions regarding bills from these companies.  You will be contacted with the lab results as soon as they are available. The fastest way to get your results is to activate your My Chart account. Instructions are located on the last page of this paperwork. If you have not heard from [place] regarding the results in 2 weeks, please contact this office.

## 2017-11-14 LAB — TSH: TSH: 0.81 u[IU]/mL (ref 0.450–4.500)

## 2017-11-14 LAB — CBC
HEMOGLOBIN: 16 g/dL (ref 13.0–17.7)
Hematocrit: 45.6 % (ref 37.5–51.0)
MCH: 31.3 pg (ref 26.6–33.0)
MCHC: 35.1 g/dL (ref 31.5–35.7)
MCV: 89 fL (ref 79–97)
Platelets: 254 10*3/uL (ref 150–450)
RBC: 5.11 x10E6/uL (ref 4.14–5.80)
RDW: 13.7 % (ref 12.3–15.4)
WBC: 8.8 10*3/uL (ref 3.4–10.8)

## 2017-11-14 LAB — LIPID PANEL
CHOLESTEROL TOTAL: 185 mg/dL (ref 100–199)
Chol/HDL Ratio: 3.1 ratio (ref 0.0–5.0)
HDL: 59 mg/dL (ref >39)
LDL CALC: 110 mg/dL — AB (ref 0–99)
TRIGLYCERIDES: 81 mg/dL (ref 0–149)
VLDL CHOLESTEROL CAL: 16 mg/dL (ref 5–40)

## 2017-11-14 LAB — COMPREHENSIVE METABOLIC PANEL
ALK PHOS: 99 IU/L (ref 39–117)
ALT: 19 IU/L (ref 0–44)
AST: 21 IU/L (ref 0–40)
Albumin/Globulin Ratio: 1.6 (ref 1.2–2.2)
Albumin: 4.2 g/dL (ref 3.5–5.5)
BUN/Creatinine Ratio: 13 (ref 9–20)
BUN: 13 mg/dL (ref 6–24)
Bilirubin Total: 0.6 mg/dL (ref 0.0–1.2)
CO2: 21 mmol/L (ref 20–29)
CREATININE: 0.98 mg/dL (ref 0.76–1.27)
Calcium: 9.2 mg/dL (ref 8.7–10.2)
Chloride: 105 mmol/L (ref 96–106)
GFR calc Af Amer: 105 mL/min/{1.73_m2} (ref >59)
GFR calc non Af Amer: 91 mL/min/{1.73_m2} (ref >59)
GLUCOSE: 100 mg/dL — AB (ref 65–99)
Globulin, Total: 2.7 g/dL (ref 1.5–4.5)
Potassium: 3.8 mmol/L (ref 3.5–5.2)
Sodium: 143 mmol/L (ref 134–144)
Total Protein: 6.9 g/dL (ref 6.0–8.5)

## 2017-11-14 LAB — GC/CHLAMYDIA PROBE AMP
Chlamydia trachomatis, NAA: NEGATIVE
NEISSERIA GONORRHOEAE BY PCR: NEGATIVE

## 2017-11-14 LAB — HIV ANTIBODY (ROUTINE TESTING W REFLEX): HIV Screen 4th Generation wRfx: NONREACTIVE

## 2017-11-14 LAB — RPR: RPR Ser Ql: NONREACTIVE

## 2017-11-19 ENCOUNTER — Encounter: Payer: Self-pay | Admitting: *Deleted

## 2017-12-06 ENCOUNTER — Telehealth: Payer: Self-pay | Admitting: Urgent Care

## 2017-12-06 NOTE — Telephone Encounter (Signed)
Copied from [place] [id number]. Topic: Quick Communication - Lab Results >> Nov 19, 2017  8:58 AM Suszanne Finch, LPN wrote: Hulen Skains patient to inform them of  lab results. When patient returns call, triage nurse may disclose results.  Patient called and is inquiring about his lab result CB# [phone]

## 2017-12-06 NOTE — Telephone Encounter (Signed)
Pt given results per notes of [name] on 11/15/17. Pt verbalized understanding.Unable to document in result note due to result note not being routed to [facility].

## 2018-10-22 DIAGNOSIS — L821 Other seborrheic keratosis: Secondary | ICD-10-CM | POA: Diagnosis not present

## 2018-10-22 DIAGNOSIS — D2272 Melanocytic nevi of left lower limb, including hip: Secondary | ICD-10-CM | POA: Diagnosis not present

## 2018-10-22 DIAGNOSIS — D225 Melanocytic nevi of trunk: Secondary | ICD-10-CM | POA: Diagnosis not present

## 2018-10-22 DIAGNOSIS — D2262 Melanocytic nevi of left upper limb, including shoulder: Secondary | ICD-10-CM | POA: Diagnosis not present

## 2018-11-08 ENCOUNTER — Encounter: Payer: Self-pay | Admitting: Registered Nurse

## 2018-11-08 ENCOUNTER — Other Ambulatory Visit (HOSPITAL_COMMUNITY)
Admission: RE | Admit: 2018-11-08 | Discharge: 2018-11-08 | Disposition: A | Discharge status: Home / Self Care | Payer: BLUE CROSS/BLUE SHIELD | Source: Ambulatory Visit | Attending: Registered Nurse | Admitting: Registered Nurse

## 2018-11-08 ENCOUNTER — Ambulatory Visit (INDEPENDENT_AMBULATORY_CARE_PROVIDER_SITE_OTHER): Payer: BLUE CROSS/BLUE SHIELD | Admitting: Registered Nurse

## 2018-11-08 ENCOUNTER — Other Ambulatory Visit: Payer: Self-pay

## 2018-11-08 DIAGNOSIS — Z0000 Encounter for general adult medical examination without abnormal findings: Secondary | ICD-10-CM | POA: Diagnosis not present

## 2018-11-08 DIAGNOSIS — R350 Frequency of micturition: Secondary | ICD-10-CM

## 2018-11-08 DIAGNOSIS — Z113 Encounter for screening for infections with a predominantly sexual mode of transmission: Secondary | ICD-10-CM | POA: Diagnosis not present

## 2018-11-08 DIAGNOSIS — Z130 Encounter for screening for diseases of the blood and blood-forming organs and certain disorders involving the immune mechanism: Secondary | ICD-10-CM | POA: Diagnosis not present

## 2018-11-08 DIAGNOSIS — Z13220 Encounter for screening for lipoid disorders: Secondary | ICD-10-CM | POA: Diagnosis not present

## 2018-11-08 DIAGNOSIS — Z0001 Encounter for general adult medical examination with abnormal findings: Secondary | ICD-10-CM

## 2018-11-08 DIAGNOSIS — Z1329 Encounter for screening for other suspected endocrine disorder: Secondary | ICD-10-CM | POA: Diagnosis not present

## 2018-11-08 LAB — POCT URINALYSIS DIP (MANUAL ENTRY)
Bilirubin, UA: NEGATIVE
Blood, UA: NEGATIVE
Glucose, UA: NEGATIVE mg/dL
Ketones, POC UA: NEGATIVE mg/dL
Leukocytes, UA: NEGATIVE
Nitrite, UA: NEGATIVE
Protein Ur, POC: NEGATIVE mg/dL
Spec Grav, UA: 1.025 (ref 1.010–1.025)
Urobilinogen, UA: 0.2 E.U./dL
pH, UA: 6.5 (ref 5.0–8.0)

## 2018-11-08 LAB — LIPID PANEL

## 2018-11-08 NOTE — Progress Notes (Signed)
Established Patient Office Visit  Subjective:  Patient ID: Alejandro Miller, male    DOB: 08-10-1970  Age: 48 y.o. MRN: [id number]  CC:  Chief Complaint  Patient presents with  . Annual Exam    HPI Alejandro Miller presents for CPE and TOC. Pt formerly on [employer].  Pt has history significant for gynecomastia and urinary urgency.  Gynecomastia: Was referred to Endo, full workup completed. He was told at that time that his symptoms were likely related to recent weight gain and that they were not of major concern - he was then discharged from their follow up.   Urinary Urgency: Has had prostate exam from [facility] in the past - had an enlarged prostate at that time. He denies a prostate exam today, wishing to defer it to next year. He states that his symptoms have not been worsening - he denies incontinence, denies post void dribbling, denies frequency, denies dysuria, denies hematuria. Urine dip ordered today.   Patient requests STD screening today: CT/GC, HIV, RPR to be completed. Additionally, he will receive CMP, CBC, TSH, Lipid Panel at this time.  He reports losing about 15 pounds in the previous year intentionally. He states that knee pain and soreness had kept him from his usual level of activity - he has focused on his diet and successfully lost weight. Regarding the knee pain, his left knee has had three operations on it: Two for ACL repair, one for meniscal repair. He denies imaging or further workup today.   Past Medical History:  Diagnosis Date  . GERD (gastroesophageal reflux disease)   . Left ACL tear     Past Surgical History:  Procedure Laterality Date  . ARTHROSCOPIC REPAIR ACL      Family History  Problem Relation Age of Onset  . Heart disease Father   . Hypertension Father   . Heart disease Paternal Grandfather   . Other Cousin        gynecomastia  . Brain cancer Mother   . Cancer Maternal Grandmother     Social History   Socioeconomic History  .  Marital status: Married    Spouse name: Not on file  . Number of children: Not on file  . Years of education: Not on file  . Highest education level: Not on file  Occupational History  . Not on file  Social Needs  . Financial resource strain: Not hard at all  . Food insecurity:    Worry: Never true    Inability: Never true  . Transportation needs:    Medical: No    Non-medical: No  Tobacco Use  . Smoking status: Former Smoker    Packs/day: 0.10    Years: 5.00    Pack years: 0.50    Start date: 11/08/1994    Last attempt to quit: 11/08/1998    Years since quitting: 20.0  . Smokeless tobacco: Never Used  Substance and Sexual Activity  . Alcohol use: Yes    Alcohol/week: 3.0 standard drinks    Types: 1 Glasses of wine, 1 Cans of beer, 1 Shots of liquor per week    Comment: "a few a week"  . Drug use: Never  . Sexual activity: Yes    Partners: Female    Birth control/protection: Condom  Lifestyle  . Physical activity:    Days per week: 3 days    Minutes per session: 30 min  . Stress: Not at all  Relationships  . Social connections:  Talks on phone: Twice a week    Gets together: Twice a week    Attends religious service: Never    Active member of club or organization: No    Attends meetings of clubs or organizations: Never    Relationship status: Married  . Intimate partner violence:    Fear of current or ex partner: No    Emotionally abused: No    Physically abused: No    Forced sexual activity: No  Other Topics Concern  . Not on file  Social History Narrative  . Not on file    Outpatient Medications Prior to Visit  Medication Sig Dispense Refill  . doxycycline (VIBRAMYCIN) 100 MG capsule Take 100 mg by mouth 2 (two) times daily.    . sildenafil (REVATIO) 20 MG tablet Take 1-2 tablets (20-40 mg total) by mouth 3 (three) times daily. 30 tablet 5  . tretinoin (RETIN-A) 0.05 % cream Apply topically at bedtime.     No facility-administered medications prior to  visit.     Allergies  Allergen Reactions  . Penicillins Rash  . Silvadene [Silver Sulfadiazine] Rash    ROS Review of Systems  Constitutional: Negative.   HENT: Negative.   Eyes: Negative.   Respiratory: Negative.   Cardiovascular: Negative.   Gastrointestinal: Negative.   Endocrine: Negative.   Genitourinary: Positive for urgency. Negative for decreased urine volume, difficulty urinating, discharge, dysuria, flank pain, frequency, hematuria, penile pain, penile swelling, scrotal swelling and testicular pain.  Musculoskeletal: Positive for arthralgias (L knee - denies workup). Negative for back pain, gait problem, joint swelling, myalgias, neck pain and neck stiffness.  Skin: Negative.   Neurological: Negative.   Hematological: Negative.   Psychiatric/Behavioral: Negative.       Objective:    Physical Exam  Constitutional: He is oriented to person, place, and time. He appears well-developed and well-nourished. No distress.  HENT:  Head: Normocephalic and atraumatic.  Right Ear: Hearing and external ear normal.  Left Ear: Hearing and external ear normal.  Nose: Nose normal.  Mouth/Throat: Oropharynx is clear and moist. No oropharyngeal exudate.  Cerumen substantial - could not visualize TMs  Eyes: EOM and lids are normal. Lids are everted and swept, no foreign bodies found. Right eye exhibits no discharge. Left eye exhibits no discharge. Right conjunctiva is not injected. Right conjunctiva has no hemorrhage. Left conjunctiva is not injected. Left conjunctiva has no hemorrhage. No scleral icterus.  Fundoscopic exam:      The right eye shows no arteriolar narrowing, no AV nicking, no hemorrhage and no papilledema. The right eye shows no red reflex.       The left eye shows no arteriolar narrowing, no AV nicking, no hemorrhage and no papilledema. The left eye shows no red reflex.  Neck: Normal range of motion. Neck supple. No tracheal deviation present.  Cardiovascular: Normal  rate, regular rhythm, normal heart sounds and intact distal pulses. Exam reveals no gallop and no friction rub.  No murmur heard. Pulmonary/Chest: Effort normal and breath sounds normal. No respiratory distress. He has no decreased breath sounds. He has no wheezes. He has no rales. He exhibits no tenderness.  Abdominal: Soft. Bowel sounds are normal. He exhibits no distension and no mass. There is no hepatosplenomegaly. There is no abdominal tenderness. There is no rebound and no guarding. No hernia. Hernia confirmed negative in the ventral area, confirmed negative in the right inguinal area and confirmed negative in the left inguinal area.  Genitourinary:    Genitourinary  Comments: Exam deferred - patient preference   Musculoskeletal: Normal range of motion.        General: No tenderness, deformity or edema.  Neurological: He is alert and oriented to person, place, and time.  Skin: Skin is warm and dry. No rash noted. He is not diaphoretic. No erythema. No pallor.  Psychiatric: He has a normal mood and affect. His behavior is normal. Judgment and thought content normal.  Nursing note and vitals reviewed.   There were no vitals taken for this visit. Wt Readings from Last 3 Encounters:  11/13/17 212 lb (96.2 kg)  05/23/17 213 lb 3.2 oz (96.7 kg)  03/13/17 214 lb (97.1 kg)     There are no preventive care reminders to display for this patient.  There are no preventive care reminders to display for this patient.  Lab Results  Component Value Date   TSH 0.810 11/13/2017   Lab Results  Component Value Date   WBC 8.8 11/13/2017   HGB 16.0 11/13/2017   HCT 45.6 11/13/2017   MCV 89 11/13/2017   PLT 254 11/13/2017   Lab Results  Component Value Date   NA 143 11/13/2017   K 3.8 11/13/2017   CO2 21 11/13/2017   GLUCOSE 100 (H) 11/13/2017   BUN 13 11/13/2017   CREATININE 0.98 11/13/2017   BILITOT 0.6 11/13/2017   ALKPHOS 99 11/13/2017   AST 21 11/13/2017   ALT 19 11/13/2017    PROT 6.9 11/13/2017   ALBUMIN 4.2 11/13/2017   CALCIUM 9.2 11/13/2017   Lab Results  Component Value Date   CHOL 185 11/13/2017   Lab Results  Component Value Date   HDL 59 11/13/2017   Lab Results  Component Value Date   LDLCALC 110 (H) 11/13/2017   Lab Results  Component Value Date   TRIG 81 11/13/2017   Lab Results  Component Value Date   CHOLHDL 3.1 11/13/2017   Lab Results  Component Value Date   HGBA1C 5.1 11/07/2016      Assessment & Plan:   Problem List Items Addressed This Visit    None    Visit Diagnoses    Annual physical exam    -  Primary   Relevant Orders   Comprehensive metabolic panel   Routine screening for STI (sexually transmitted infection)       Relevant Orders   HIV Antibody (routine testing w rflx)   RPR   GC/Chlamydia probe amp ([place])not at [facility]   Screening for deficiency anemia       Relevant Orders   CBC with Differential/Platelet   Screening cholesterol level       Relevant Orders   Lipid panel   Screening for thyroid disorder       Relevant Orders   TSH   Urinary frequency       Relevant Orders   POCT urinalysis dipstick (Completed)   Routine general medical examination at a health care facility          No orders of the defined types were placed in this encounter.   Follow-up: Return in 1 year (on 11/08/2019). for CPE  PLAN:  Pt presents for CPE - routine labs collected. No acute complaints today.  Requests STD screen - will follow up with these and other tests as warranted. Pt understands "no news is good news"  Pt does not regularly take medication - takes sildenafil and doxycycline as needed for ED and acne, as needed. Does not need refills at  this time.  Otherwise, Mr. Tillman Sers appears to be in good health and should expect to return in one year for next CPE.  Patient encouraged to call clinic with any questions, comments, or concerns.  I spent 29 minutes with this patient, more than 50% of which was  spent counseling/educating.  Maximiano Coss, NP

## 2018-11-08 NOTE — Patient Instructions (Addendum)

## 2018-11-09 LAB — CBC WITH DIFFERENTIAL/PLATELET
Basophils Absolute: 0.1 10*3/uL (ref 0.0–0.2)
Basos: 1 %
EOS (ABSOLUTE): 0.2 10*3/uL (ref 0.0–0.4)
Eos: 2 %
Hematocrit: 47.4 % (ref 37.5–51.0)
Hemoglobin: 16.3 g/dL (ref 13.0–17.7)
Immature Grans (Abs): 0 10*3/uL (ref 0.0–0.1)
Immature Granulocytes: 0 %
Lymphocytes Absolute: 2.9 10*3/uL (ref 0.7–3.1)
Lymphs: 37 %
MCH: 31 pg (ref 26.6–33.0)
MCHC: 34.4 g/dL (ref 31.5–35.7)
MCV: 90 fL (ref 79–97)
Monocytes Absolute: 0.6 10*3/uL (ref 0.1–0.9)
Monocytes: 8 %
Neutrophils Absolute: 4.1 10*3/uL (ref 1.4–7.0)
Neutrophils: 52 %
Platelets: 248 10*3/uL (ref 150–450)
RBC: 5.25 x10E6/uL (ref 4.14–5.80)
RDW: 12.1 % (ref 11.6–15.4)
WBC: 7.7 10*3/uL (ref 3.4–10.8)

## 2018-11-09 LAB — LIPID PANEL
Chol/HDL Ratio: 3.5 ratio (ref 0.0–5.0)
Cholesterol, Total: 190 mg/dL (ref 100–199)
HDL: 54 mg/dL (ref >39)
LDL Calculated: 119 mg/dL — ABNORMAL HIGH (ref 0–99)
Triglycerides: 85 mg/dL (ref 0–149)
VLDL Cholesterol Cal: 17 mg/dL (ref 5–40)

## 2018-11-09 LAB — COMPREHENSIVE METABOLIC PANEL
ALT: 18 IU/L (ref 0–44)
AST: 14 IU/L (ref 0–40)
Albumin/Globulin Ratio: 1.8 (ref 1.2–2.2)
Albumin: 4.4 g/dL (ref 4.0–5.0)
Alkaline Phosphatase: 104 IU/L (ref 39–117)
BUN/Creatinine Ratio: 12 (ref 9–20)
BUN: 11 mg/dL (ref 6–24)
Bilirubin Total: 0.6 mg/dL (ref 0.0–1.2)
CO2: 22 mmol/L (ref 20–29)
Calcium: 9.1 mg/dL (ref 8.7–10.2)
Chloride: 104 mmol/L (ref 96–106)
Creatinine, Ser: 0.94 mg/dL (ref 0.76–1.27)
GFR calc Af Amer: 110 mL/min/{1.73_m2} (ref >59)
GFR calc non Af Amer: 95 mL/min/{1.73_m2} (ref >59)
Globulin, Total: 2.4 g/dL (ref 1.5–4.5)
Glucose: 96 mg/dL (ref 65–99)
Potassium: 3.9 mmol/L (ref 3.5–5.2)
Sodium: 143 mmol/L (ref 134–144)
Total Protein: 6.8 g/dL (ref 6.0–8.5)

## 2018-11-09 LAB — HIV ANTIBODY (ROUTINE TESTING W REFLEX): HIV Screen 4th Generation wRfx: NONREACTIVE

## 2018-11-09 LAB — RPR: RPR Ser Ql: NONREACTIVE

## 2018-11-09 LAB — TSH: TSH: 1.6 u[IU]/mL (ref 0.450–4.500)

## 2018-11-11 LAB — GC/CHLAMYDIA PROBE AMP (~~LOC~~) NOT AT ARMC
Chlamydia: NEGATIVE
Neisseria Gonorrhea: NEGATIVE

## 2019-01-29 IMAGING — DX DG CHEST 2V
2 series · 2 of 2 positions shown · non-contrast
Comparison: None.

CLINICAL DATA: Breast pain in a male

EXAM:
CHEST  2 VIEW

[chest pa]
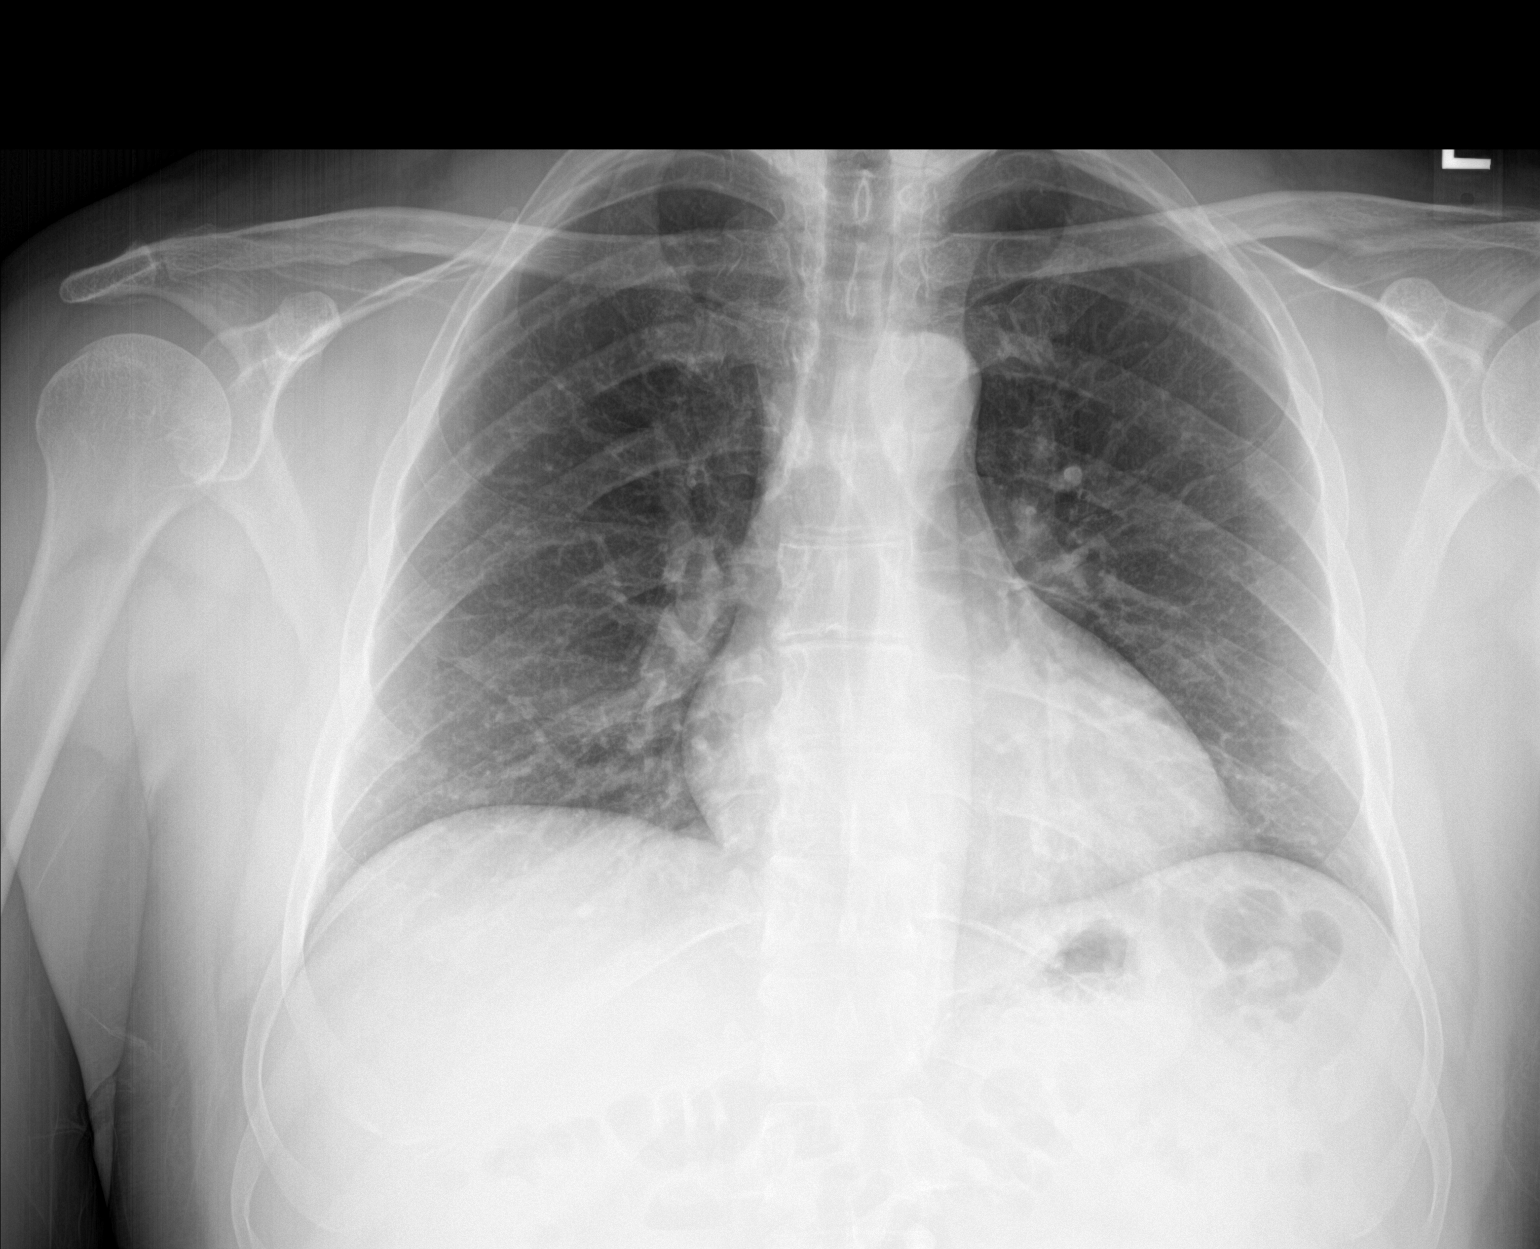

[chest lat]
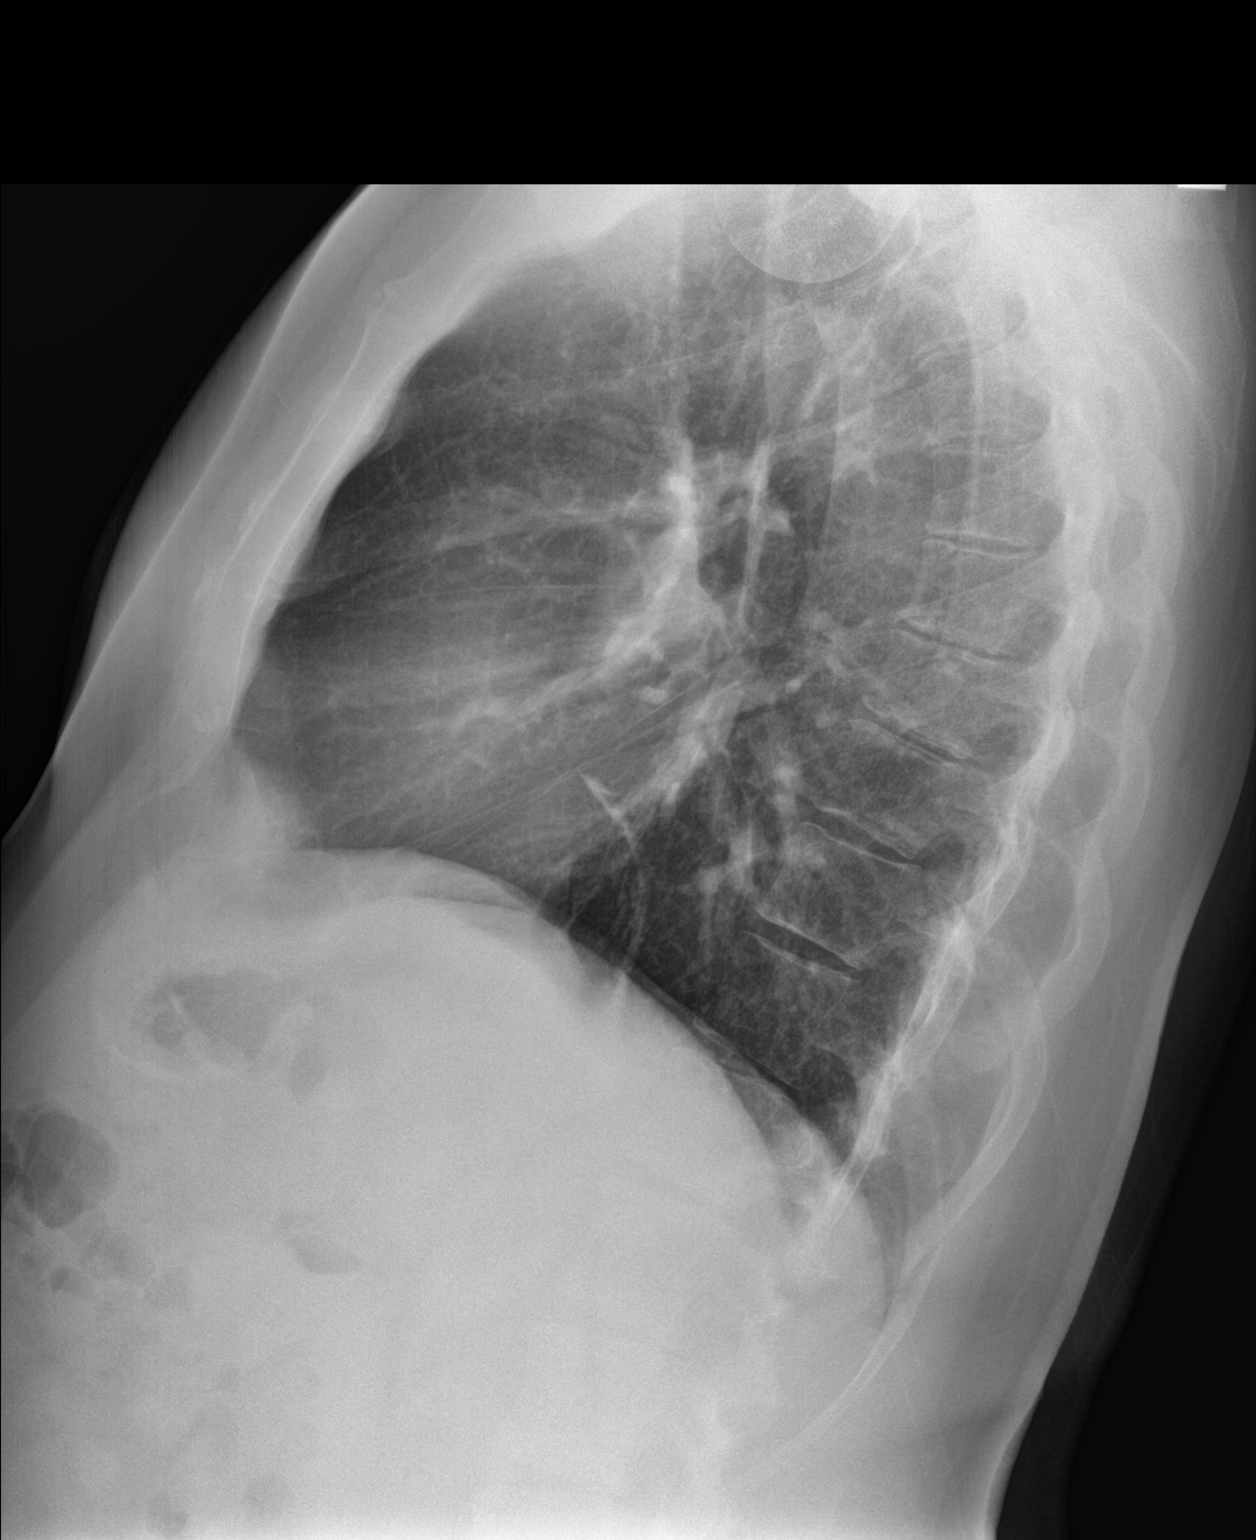

[2 of 2 positions shown; findings below may reference images not displayed]

FINDINGS: Mild linear scarring is noted at the lung base on the lateral view
but no pneumonia or effusion is seen. Mediastinal and hilar contours
are unremarkable. The heart is within upper limits normal. No bony
abnormality is seen
IMPRESSION: Mild basilar linear scarring or atelectasis.  No active process.

## 2019-02-08 DIAGNOSIS — Z1159 Encounter for screening for other viral diseases: Secondary | ICD-10-CM | POA: Diagnosis not present

## 2019-05-12 ENCOUNTER — Other Ambulatory Visit: Payer: Self-pay | Admitting: Registered Nurse

## 2019-05-12 DIAGNOSIS — N529 Male erectile dysfunction, unspecified: Secondary | ICD-10-CM

## 2019-05-12 MED ORDER — SILDENAFIL CITRATE 20 MG PO TABS: 20.0000 mg | ORAL_TABLET | Freq: Three times a day (TID) | ORAL | 1 refills | Status: DC

## 2019-05-12 NOTE — Telephone Encounter (Signed)
Copied from [place] [id number]. Topic: Quick Communication - Rx Refill/Question >> May 12, 2019 11:13 AM Leward Quan A wrote: Medication: sildenafil (REVATIO) 20 MG tablet   Has the patient contacted their pharmacy? Yes.   (Agent: If no, request that the patient contact the pharmacy for the refill.) (Agent: If yes, when and what did the pharmacy advise?)  Preferred Pharmacy (with phone number or street name): [id number] - [place], [place]. [address] [phone] (Phone) [phone] (Fax)    Agent: Please be advised that RX refills may take up to 3 business days. We ask that you follow-up with your pharmacy.

## 2019-08-13 ENCOUNTER — Encounter: Payer: Self-pay | Admitting: Gastroenterology

## 2019-08-20 ENCOUNTER — Ambulatory Visit (AMBULATORY_SURGERY_CENTER): Payer: Self-pay

## 2019-08-20 ENCOUNTER — Other Ambulatory Visit: Payer: Self-pay

## 2019-08-20 VITALS — Temp 97.5°F | Ht 68.5 in | Wt 194.0 lb

## 2019-08-20 DIAGNOSIS — Z01818 Encounter for other preprocedural examination: Secondary | ICD-10-CM

## 2019-08-20 DIAGNOSIS — Z1211 Encounter for screening for malignant neoplasm of colon: Secondary | ICD-10-CM

## 2019-08-20 MED ORDER — NA SULFATE-K SULFATE-MG SULF 17.5-3.13-1.6 GM/177ML PO SOLN: 1.0000 | Freq: Once | ORAL | 0 refills | Status: AC

## 2019-08-20 NOTE — Progress Notes (Signed)
No egg or soy allergy known to patient  No issues with past sedation with any surgeries  or procedures, no intubation problems  No diet pills per patient No home 02 use per patient  No blood thinners per patient  Pt denies issues with constipation  No A fib or A flutter  EMMI video sent to pt's e mail   Pt understands care partner requirements and Covid safety practices.

## 2019-08-22 ENCOUNTER — Ambulatory Visit (INDEPENDENT_AMBULATORY_CARE_PROVIDER_SITE_OTHER): Payer: 59

## 2019-08-22 ENCOUNTER — Other Ambulatory Visit (HOSPITAL_COMMUNITY)
Admission: RE | Admit: 2019-08-22 | Discharge: 2019-08-22 | Disposition: A | Discharge status: Home / Self Care | Payer: 59 | Source: Ambulatory Visit | Attending: Registered Nurse | Admitting: Registered Nurse

## 2019-08-22 ENCOUNTER — Other Ambulatory Visit: Payer: Self-pay

## 2019-08-22 ENCOUNTER — Encounter: Payer: Self-pay | Admitting: Registered Nurse

## 2019-08-22 ENCOUNTER — Ambulatory Visit (INDEPENDENT_AMBULATORY_CARE_PROVIDER_SITE_OTHER): Payer: 59 | Admitting: Registered Nurse

## 2019-08-22 VITALS — BP 122/74 | HR 80 | Temp 97.2°F | Ht 68.5 in | Wt 194.6 lb

## 2019-08-22 DIAGNOSIS — Z113 Encounter for screening for infections with a predominantly sexual mode of transmission: Secondary | ICD-10-CM | POA: Insufficient documentation

## 2019-08-22 DIAGNOSIS — Z0001 Encounter for general adult medical examination with abnormal findings: Secondary | ICD-10-CM

## 2019-08-22 DIAGNOSIS — M25562 Pain in left knee: Secondary | ICD-10-CM | POA: Diagnosis not present

## 2019-08-22 DIAGNOSIS — Z125 Encounter for screening for malignant neoplasm of prostate: Secondary | ICD-10-CM

## 2019-08-22 DIAGNOSIS — Z13220 Encounter for screening for lipoid disorders: Secondary | ICD-10-CM

## 2019-08-22 DIAGNOSIS — Z13228 Encounter for screening for other metabolic disorders: Secondary | ICD-10-CM

## 2019-08-22 DIAGNOSIS — Z130 Encounter for screening for diseases of the blood and blood-forming organs and certain disorders involving the immune mechanism: Secondary | ICD-10-CM

## 2019-08-22 DIAGNOSIS — Z1329 Encounter for screening for other suspected endocrine disorder: Secondary | ICD-10-CM | POA: Diagnosis not present

## 2019-08-22 DIAGNOSIS — Z0000 Encounter for general adult medical examination without abnormal findings: Secondary | ICD-10-CM

## 2019-08-22 DIAGNOSIS — N529 Male erectile dysfunction, unspecified: Secondary | ICD-10-CM

## 2019-08-22 MED ORDER — SILDENAFIL CITRATE 20 MG PO TABS: 20.0000 mg | ORAL_TABLET | Freq: Three times a day (TID) | ORAL | 1 refills | Status: DC

## 2019-08-22 MED ORDER — DICLOFENAC SODIUM 75 MG PO TBEC: 75.0000 mg | DELAYED_RELEASE_TABLET | Freq: Two times a day (BID) | ORAL | 0 refills | Status: AC

## 2019-08-22 NOTE — Progress Notes (Signed)
Established Patient Office Visit  Subjective:  Patient ID: Alejandro Miller, male    DOB: 01/10/1971  Age: 49 y.o. MRN: [id number]  CC:  Chief Complaint  Patient presents with  . Annual Exam    physical also patient is having a little pain in the left knee 1 month ago .    HPI Alejandro Miller presents for CPE  Knee pain: fell around 1 mo ago. Acute pain and swelling in knee. RICE reduced swelling and pain improved for 1-2 weeks then plateau. Having aching soreness in medial of L knee. No gait problem. No pain radiation. Has had meniscus tear and ACL tear/repair in this knee previously.  STI testing: No symptoms, no urgent concerns, prefers to get routine testing if anticipating new partner.  Otherwise, no concerns. Feels well overall. Needs refill on sildenafil.  Past Medical History:  Diagnosis Date  . Allergy    per pt  . GERD (gastroesophageal reflux disease)   . Hyperlipidemia    per pt  . Left ACL tear     Past Surgical History:  Procedure Laterality Date  . ARTHROSCOPIC REPAIR ACL    . WISDOM TOOTH EXTRACTION      Family History  Problem Relation Age of Onset  . Heart disease Father   . Hypertension Father   . Heart disease Paternal Grandfather   . Other Cousin        gynecomastia  . Brain cancer Mother   . Cancer Maternal Grandmother   . Colon polyps Neg Hx   . Colon cancer Neg Hx   . Esophageal cancer Neg Hx   . Rectal cancer Neg Hx   . Stomach cancer Neg Hx     Social History   Socioeconomic History  . Marital status: Married    Spouse name: Not on file  . Number of children: Not on file  . Years of education: Not on file  . Highest education level: Not on file  Occupational History  . Not on file  Tobacco Use  . Smoking status: Former Smoker    Packs/day: 0.10    Years: 5.00    Pack years: 0.50    Start date: 11/08/1994    Quit date: 11/08/1998    Years since quitting: 20.8  . Smokeless tobacco: Never Used  Substance and Sexual Activity  .  Alcohol use: Yes    Alcohol/week: 3.0 standard drinks    Types: 1 Glasses of wine, 1 Cans of beer, 1 Shots of liquor per week    Comment: "a few a week"  . Drug use: Never  . Sexual activity: Yes    Partners: Female    Birth control/protection: Condom  Other Topics Concern  . Not on file  Social History Narrative  . Not on file   Social Determinants of Health   Financial Resource Strain: Low Risk   . Difficulty of Paying Living Expenses: Not hard at all  Food Insecurity: No Food Insecurity  . Worried About Charity fundraiser in the Last Year: Never true  . Ran Out of Food in the Last Year: Never true  Transportation Needs: No Transportation Needs  . Lack of Transportation (Medical): No  . Lack of Transportation (Non-Medical): No  Physical Activity: Insufficiently Active  . Days of Exercise per Week: 3 days  . Minutes of Exercise per Session: 30 min  Stress: No Stress Concern Present  . Feeling of Stress : Not at all  Social Connections: Somewhat Isolated  .  Frequency of Communication with Friends and Family: Twice a week  . Frequency of Social Gatherings with Friends and Family: Twice a week  . Attends Religious Services: Never  . Active Member of Clubs or Organizations: No  . Attends Archivist Meetings: Never  . Marital Status: Married  Human resources officer Violence: Not At Risk  . Fear of Current or Ex-Partner: No  . Emotionally Abused: No  . Physically Abused: No  . Sexually Abused: No    Outpatient Medications Prior to Visit  Medication Sig Dispense Refill  . doxycycline (VIBRAMYCIN) 100 MG capsule Take 100 mg by mouth 2 (two) times daily.    . sildenafil (REVATIO) 20 MG tablet Take 1-2 tablets (20-40 mg total) by mouth 3 (three) times daily. 30 tablet 1  . tretinoin (RETIN-A) 0.05 % cream Apply topically at bedtime.     No facility-administered medications prior to visit.    Allergies  Allergen Reactions  . Penicillins Rash  . Silvadene [Silver  Sulfadiazine] Rash    ROS Review of Systems  Constitutional: Negative.   HENT: Negative.   Eyes: Negative.   Respiratory: Negative.   Cardiovascular: Negative.   Gastrointestinal: Negative.   Endocrine: Negative.   Genitourinary: Negative.   Musculoskeletal: Positive for arthralgias. Negative for back pain, gait problem, joint swelling, myalgias, neck pain and neck stiffness.  Skin: Negative.   Allergic/Immunologic: Negative.   Neurological: Negative.   Hematological: Negative.   Psychiatric/Behavioral: Negative.   All other systems reviewed and are negative.     Objective:    Physical Exam  Constitutional: He is oriented to person, place, and time. He appears well-developed and well-nourished. No distress.  HENT:  Head: Normocephalic and atraumatic.  Right Ear: External ear normal.  Left Ear: External ear normal.  Nose: Nose normal.  Mouth/Throat: Oropharynx is clear and moist. No oropharyngeal exudate.  Eyes: Pupils are equal, round, and reactive to light. Conjunctivae and EOM are normal. Right eye exhibits no discharge. Left eye exhibits no discharge. No scleral icterus.  Neck: No tracheal deviation present. No thyromegaly present.  Cardiovascular: Normal rate, regular rhythm, normal heart sounds and intact distal pulses. Exam reveals no gallop and no friction rub.  No murmur heard. Pulmonary/Chest: Effort normal and breath sounds normal. No respiratory distress. He has no wheezes. He has no rales. He exhibits no tenderness.  Abdominal: Soft. Bowel sounds are normal. He exhibits no distension and no mass. There is no abdominal tenderness. There is no rebound and no guarding.  Musculoskeletal:        General: Tenderness (medial aspect of L knee) present. No deformity or edema. Normal range of motion.     Cervical back: Normal range of motion and neck supple.  Lymphadenopathy:    He has no cervical adenopathy.  Neurological: He is alert and oriented to person, place, and  time. No cranial nerve deficit. Coordination normal.  Skin: Skin is warm and dry. No rash noted. He is not diaphoretic. No erythema. No pallor.  Psychiatric: He has a normal mood and affect. His behavior is normal. Judgment and thought content normal.  Nursing note and vitals reviewed.   BP 122/74   Pulse 80   Temp (!) 97.2 F (36.2 C) (Temporal)   Ht 5' 8.5" (1.74 m)   Wt 194 lb 9.6 oz (88.3 kg)   SpO2 96%   BMI 29.16 kg/m  Wt Readings from Last 3 Encounters:  08/22/19 194 lb 9.6 oz (88.3 kg)  08/20/19 194 lb (88  kg)  11/13/17 212 lb (96.2 kg)     There are no preventive care reminders to display for this patient.  There are no preventive care reminders to display for this patient.  Lab Results  Component Value Date   TSH 1.600 11/08/2018   Lab Results  Component Value Date   WBC 7.7 11/08/2018   HGB 16.3 11/08/2018   HCT 47.4 11/08/2018   MCV 90 11/08/2018   PLT 248 11/08/2018   Lab Results  Component Value Date   NA 143 11/08/2018   K 3.9 11/08/2018   CO2 22 11/08/2018   GLUCOSE 96 11/08/2018   BUN 11 11/08/2018   CREATININE 0.94 11/08/2018   BILITOT 0.6 11/08/2018   ALKPHOS 104 11/08/2018   AST 14 11/08/2018   ALT 18 11/08/2018   PROT 6.8 11/08/2018   ALBUMIN 4.4 11/08/2018   CALCIUM 9.1 11/08/2018   Lab Results  Component Value Date   CHOL 190 11/08/2018   Lab Results  Component Value Date   HDL 54 11/08/2018   Lab Results  Component Value Date   LDLCALC 119 (H) 11/08/2018   Lab Results  Component Value Date   TRIG 85 11/08/2018   Lab Results  Component Value Date   CHOLHDL 3.5 11/08/2018   Lab Results  Component Value Date   HGBA1C 5.1 11/07/2016      Assessment & Plan:   Problem List Items Addressed This Visit    None    Visit Diagnoses    Annual physical exam    -  Primary   Routine screening for STI (sexually transmitted infection)       Relevant Orders   Urine cytology ancillary only   HIV antibody (with reflex)    RPR   Screening for endocrine, metabolic and immunity disorder       Relevant Orders   Comprehensive metabolic panel   CBC   TSH   Hemoglobin A1c   Lipid screening       Relevant Orders   Lipid Panel   Screening PSA (prostate specific antigen)       Relevant Orders   PSA   Medial knee pain, left       Relevant Medications   diclofenac (VOLTAREN) 75 MG EC tablet   Other Relevant Orders   DG Knee Complete 4 Views Left (Completed)      Meds ordered this encounter  Medications  . diclofenac (VOLTAREN) 75 MG EC tablet    Sig: Take 1 tablet (75 mg total) by mouth 2 (two) times daily.    Dispense:  30 tablet    Refill:  0    Order Specific Question:   Supervising Provider    Answer:   Forrest Moron [id number]    Follow-up: No follow-ups on file.   PLAN  Medial aspect of L knee tender, full ROM, xray reassuring beyond mild degenerative changes. Feel this may have been an MCL sprain, testing for meniscal tear not concerning   Otherwise unremarkable exam  Labs drawn, will follow up as warranted  Refill sildenafil  Patient encouraged to call clinic with any questions, comments, or concerns.  Maximiano Coss, NP

## 2019-08-22 NOTE — Patient Instructions (Signed)
° ° ° °  If you have lab work done today you will be contacted with your lab results within the next 2 weeks.  If you have not heard from us then please contact us. The fastest way to get your results is to register for My Chart. ° ° °IF you received an x-ray today, you will receive an invoice from [facility]. Please contact [facility] at [phone] with questions or concerns regarding your invoice.  ° °IF you received labwork today, you will receive an invoice from [facility]. Please contact [facility] at [phone] with questions or concerns regarding your invoice.  ° °Our billing staff will not be able to assist you with questions regarding bills from these companies. ° °You will be contacted with the lab results as soon as they are available. The fastest way to get your results is to activate your My Chart account. Instructions are located on the last page of this paperwork. If you have not heard from us regarding the results in 2 weeks, please contact this office. °  ° ° ° °

## 2019-08-23 LAB — CBC
Hematocrit: 43.1 % (ref 37.5–51.0)
Hemoglobin: 15.4 g/dL (ref 13.0–17.7)
MCH: 32 pg (ref 26.6–33.0)
MCHC: 35.7 g/dL (ref 31.5–35.7)
MCV: 89 fL (ref 79–97)
Platelets: 244 10*3/uL (ref 150–450)
RBC: 4.82 x10E6/uL (ref 4.14–5.80)
RDW: 12.7 % (ref 11.6–15.4)
WBC: 8.8 10*3/uL (ref 3.4–10.8)

## 2019-08-23 LAB — COMPREHENSIVE METABOLIC PANEL
ALT: 27 IU/L (ref 0–44)
AST: 31 IU/L (ref 0–40)
Albumin/Globulin Ratio: 1.7 (ref 1.2–2.2)
Albumin: 4.5 g/dL (ref 4.0–5.0)
Alkaline Phosphatase: 93 IU/L (ref 39–117)
BUN/Creatinine Ratio: 11 (ref 9–20)
BUN: 9 mg/dL (ref 6–24)
Bilirubin Total: 0.7 mg/dL (ref 0.0–1.2)
CO2: 23 mmol/L (ref 20–29)
Calcium: 9.1 mg/dL (ref 8.7–10.2)
Chloride: 103 mmol/L (ref 96–106)
Creatinine, Ser: 0.82 mg/dL (ref 0.76–1.27)
GFR calc Af Amer: 120 mL/min/{1.73_m2} (ref >59)
GFR calc non Af Amer: 104 mL/min/{1.73_m2} (ref >59)
Globulin, Total: 2.6 g/dL (ref 1.5–4.5)
Glucose: 85 mg/dL (ref 65–99)
Potassium: 3.7 mmol/L (ref 3.5–5.2)
Sodium: 141 mmol/L (ref 134–144)
Total Protein: 7.1 g/dL (ref 6.0–8.5)

## 2019-08-23 LAB — HIV ANTIBODY (ROUTINE TESTING W REFLEX): HIV Screen 4th Generation wRfx: NONREACTIVE

## 2019-08-23 LAB — LIPID PANEL
Chol/HDL Ratio: 2.9 ratio (ref 0.0–5.0)
Cholesterol, Total: 177 mg/dL (ref 100–199)
HDL: 61 mg/dL (ref >39)
LDL Chol Calc (NIH): 103 mg/dL — ABNORMAL HIGH (ref 0–99)
Triglycerides: 69 mg/dL (ref 0–149)
VLDL Cholesterol Cal: 13 mg/dL (ref 5–40)

## 2019-08-23 LAB — PSA: Prostate Specific Ag, Serum: 0.7 ng/mL (ref 0.0–4.0)

## 2019-08-23 LAB — TSH: TSH: 0.765 u[IU]/mL (ref 0.450–4.500)

## 2019-08-23 LAB — HEMOGLOBIN A1C
Est. average glucose Bld gHb Est-mCnc: 97 mg/dL
Hgb A1c MFr Bld: 5 % (ref 4.8–5.6)

## 2019-08-23 LAB — RPR: RPR Ser Ql: NONREACTIVE

## 2019-08-25 NOTE — Progress Notes (Signed)
Good evening,  Normal lab results letter please!  Thank you,  Kathrin Ruddy, NP

## 2019-08-26 ENCOUNTER — Ambulatory Visit (INDEPENDENT_AMBULATORY_CARE_PROVIDER_SITE_OTHER): Payer: 59

## 2019-08-26 DIAGNOSIS — Z1159 Encounter for screening for other viral diseases: Secondary | ICD-10-CM

## 2019-08-26 LAB — SARS CORONAVIRUS 2 (TAT 6-24 HRS): SARS Coronavirus 2: NEGATIVE

## 2019-08-27 ENCOUNTER — Encounter: Payer: Self-pay | Admitting: Gastroenterology

## 2019-08-27 LAB — URINE CYTOLOGY ANCILLARY ONLY
Chlamydia: NEGATIVE
Comment: NEGATIVE
Comment: NEGATIVE
Comment: NORMAL
Neisseria Gonorrhea: NEGATIVE
Trichomonas: NEGATIVE

## 2019-08-29 ENCOUNTER — Encounter: Payer: Self-pay | Admitting: Gastroenterology

## 2019-08-29 ENCOUNTER — Ambulatory Visit (AMBULATORY_SURGERY_CENTER): Payer: 59 | Admitting: Gastroenterology

## 2019-08-29 ENCOUNTER — Other Ambulatory Visit: Payer: Self-pay

## 2019-08-29 VITALS — BP 121/72 | HR 62 | Temp 97.5°F | Resp 10 | Ht 68.0 in | Wt 194.0 lb

## 2019-08-29 DIAGNOSIS — Z1211 Encounter for screening for malignant neoplasm of colon: Secondary | ICD-10-CM | POA: Diagnosis present

## 2019-08-29 DIAGNOSIS — A630 Anogenital (venereal) warts: Secondary | ICD-10-CM

## 2019-08-29 DIAGNOSIS — D122 Benign neoplasm of ascending colon: Secondary | ICD-10-CM

## 2019-08-29 DIAGNOSIS — D128 Benign neoplasm of rectum: Secondary | ICD-10-CM | POA: Diagnosis not present

## 2019-08-29 NOTE — Progress Notes (Signed)
Pt's states no medical or surgical changes since previsit or office visit.  [place]

## 2019-08-29 NOTE — Patient Instructions (Signed)
Impression/Recommendations:  Polyp handout given to patient. Diverticulosis handout given to patient. Hemorrhoid handout given to patient.  Resume previous diet. Continue present medications. Await pathology results.  YOU HAD AN ENDOSCOPIC PROCEDURE TODAY AT THE [facility]:   Refer to the procedure report that was given to you for any specific questions about what was found during the examination.  If the procedure report does not answer your questions, please call your gastroenterologist to clarify.  If you requested that your care partner not be given the details of your procedure findings, then the procedure report has been included in a sealed envelope for you to review at your convenience later.  YOU SHOULD EXPECT: Some feelings of bloating in the abdomen. Passage of more gas than usual.  Walking can help get rid of the air that was put into your GI tract during the procedure and reduce the bloating. If you had a lower endoscopy (such as a colonoscopy or flexible sigmoidoscopy) you may notice spotting of blood in your stool or on the toilet paper. If you underwent a bowel prep for your procedure, you may not have a normal bowel movement for a few days.  Please Note:  You might notice some irritation and congestion in your nose or some drainage.  This is from the oxygen used during your procedure.  There is no need for concern and it should clear up in a day or so.  SYMPTOMS TO REPORT IMMEDIATELY:   Following lower endoscopy (colonoscopy or flexible sigmoidoscopy):  Excessive amounts of blood in the stool  Significant tenderness or worsening of abdominal pains  Swelling of the abdomen that is new, acute  Fever of 100F or higher For urgent or emergent issues, a gastroenterologist can be reached at any hour by calling [phone]. Do not use MyChart messaging for urgent concerns.    DIET:  We do recommend a small meal at first, but then you may proceed to your regular  diet.  Drink plenty of fluids but you should avoid alcoholic beverages for 24 hours.  ACTIVITY:  You should plan to take it easy for the rest of today and you should NOT DRIVE or use heavy machinery until tomorrow (because of the sedation medicines used during the test).    FOLLOW UP: Our staff will call the number listed on your records 48-72 hours following your procedure to check on you and address any questions or concerns that you may have regarding the information given to you following your procedure. If we do not reach you, we will leave a message.  We will attempt to reach you two times.  During this call, we will ask if you have developed any symptoms of COVID 19. If you develop any symptoms (ie: fever, flu-like symptoms, shortness of breath, cough etc.) before then, please call [phone].  If you test positive for Covid 19 in the 2 weeks post procedure, please call and report this information to us.    If any biopsies were taken you will be contacted by phone or by letter within the next 1-3 weeks.  Please call us at [phone] if you have not heard about the biopsies in 3 weeks.    SIGNATURES/CONFIDENTIALITY: You and/or your care partner have signed paperwork which will be entered into your electronic medical record.  These signatures attest to the fact that that the information above on your After Visit Summary has been reviewed and is understood.  Full responsibility of the confidentiality of this   discharge information lies with you and/or your care-partner. 

## 2019-08-29 NOTE — Op Note (Signed)
[name] Patient Name: Alejandro Miller Procedure Date: 08/29/2019 3:31 PM MRN: [id number] Endoscopist: Remo Lipps P. Havery Moros , MD Age: 49 Referring MD:  Date of Birth: 1970-06-26 Gender: Male Account #: [id number] Procedure:                Colonoscopy Indications:              Screening for colorectal malignant neoplasm, This                            is the patient's first colonoscopy Medicines:                Monitored Anesthesia Care Procedure:                Pre-Anesthesia Assessment:                           - Prior to the procedure, a History and Physical                            was performed, and patient medications and                            allergies were reviewed. The patient's tolerance of                            previous anesthesia was also reviewed. The risks                            and benefits of the procedure and the sedation                            options and risks were discussed with the patient.                            All questions were answered, and informed consent                            was obtained. Prior Anticoagulants: The patient has                            taken no previous anticoagulant or antiplatelet                            agents. ASA Grade Assessment: II - A patient with                            mild systemic disease. After reviewing the risks                            and benefits, the patient was deemed in                            satisfactory condition to undergo the procedure.  After obtaining informed consent, the colonoscope                            was passed under direct vision. Throughout the                            procedure, the patient's blood pressure, pulse, and                            oxygen saturations were monitored continuously. The                            Colonoscope was introduced through the anus and                            advanced to the the  cecum, identified by                            appendiceal orifice and ileocecal valve. The                            colonoscopy was performed without difficulty. The                            patient tolerated the procedure well. The quality                            of the bowel preparation was good. The ileocecal                            valve, appendiceal orifice, and rectum were                            photographed. Scope In: 3:42:29 PM Scope Out: 4:03:01 PM Scope Withdrawal Time: 0 hours 11 minutes 29 seconds  Total Procedure Duration: 0 hours 20 minutes 32 seconds  Findings:                 The perianal and digital rectal examinations were                            normal other than the prostate gland seemed                            enlarged but no nodules palpated.                           A 4 mm polyp was found in the ascending colon. The                            polyp was sessile. The polyp was removed with a                            cold snare. Resection and retrieval were complete.  A 3 mm polyp was found in the rectum. The polyp was                            sessile. The polyp was removed with a cold snare.                            Resection and retrieval were complete.                           Multiple medium-mouthed diverticula were found in                            the sigmoid colon.                           A few small suspected condylomata were found along                            the dentate line. Biopsies were taken with a cold                            forceps for histology to assess for AIN.                           Internal hemorrhoids were found during                            retroflexion. The hemorrhoids were small.                           The exam was otherwise without abnormality. Complications:            No immediate complications. Estimated blood loss:                            Minimal. Estimated  Blood Loss:     Estimated blood loss was minimal. Impression:               - One 4 mm polyp in the ascending colon, removed                            with a cold snare. Resected and retrieved.                           - One 3 mm polyp in the rectum, removed with a cold                            snare. Resected and retrieved.                           - Diverticulosis in the sigmoid colon.                           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                           -  Internal hemorrhoids.                           - The examination was otherwise normal. Recommendation:           - Patient has a contact number available for                            emergencies. The signs and symptoms of potential                            delayed complications were discussed with the                            patient. Return to normal activities tomorrow.                            Written discharge instructions were provided to the                            patient.                           - Resume previous diet.                           - Continue present medications.                           - Await pathology results with further                            recommendations. Remo Lipps P. [name], MD 08/29/2019 4:08:41 PM This report has been signed electronically.

## 2019-08-29 NOTE — Progress Notes (Signed)
A and O x3. Report to RN. Tolerated MAC anesthesia well.

## 2019-08-29 NOTE — Progress Notes (Signed)
Called to room to assist during endoscopic procedure.  Patient ID and intended procedure confirmed with present staff. Received instructions for my participation in the procedure from the performing physician.  

## 2019-09-02 ENCOUNTER — Telehealth: Payer: Self-pay | Admitting: *Deleted

## 2019-09-02 ENCOUNTER — Telehealth: Payer: Self-pay

## 2019-09-02 NOTE — Telephone Encounter (Signed)
No answer, left message to call back later today, B.Floy Riegler RN. 

## 2019-09-02 NOTE — Telephone Encounter (Signed)
  Follow up Call-  Call back number 08/29/2019  Post procedure Call Back phone  # [phone]  Permission to leave phone message Yes  Some recent data might be hidden     Patient questions:  Do you have a fever, pain , or abdominal swelling? No. Pain Score  0 *  Have you tolerated food without any problems? Yes.    Have you been able to return to your normal activities? Yes.    Do you have any questions about your discharge instructions: Diet   No. Medications  No. Follow up visit  No.  Do you have questions or concerns about your Care? No.  Actions: * If pain score is 4 or above: No action needed, pain <4.  1. Have you developed a fever since your procedure? no  2.   Have you had an respiratory symptoms (SOB or cough) since your procedure? no  3.   Have you tested positive for COVID 19 since your procedure no  4.   Have you had any family members/close contacts diagnosed with the COVID 19 since your procedure?  no   If yes to any of these questions please route to Joylene Ronaldo, RN and Erenest Rasher, RN

## 2019-09-08 ENCOUNTER — Telehealth: Payer: Self-pay | Admitting: Gastroenterology

## 2019-09-08 NOTE — Telephone Encounter (Signed)
Pt stated that he had spoken to someone regarding and referral to see a surgeon.  He inquired about lab results and what warrants the referral for surgery.

## 2019-09-09 NOTE — Telephone Encounter (Signed)
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 All questions answered.  He will call back for any additional questions or concerns.

## 2019-09-10 NOTE — Telephone Encounter (Signed)
Patient was referred to [facility] to evaluate the condyloma. Patient was contacted by [facility] and declined to schedule.  He will reconsider and contact them to schedule consult.

## 2019-10-20 ENCOUNTER — Telehealth: Payer: Self-pay | Admitting: Gastroenterology

## 2019-10-20 NOTE — Telephone Encounter (Signed)
Requested by Georgette Shell, Clinical supervisor, to contact patient regarding his concerns on out-of-pocket expenses.  Contacted patient by phone. Patient explained that he currently had about $1400 in expenses from the [facility] procedure visit from 08/29/19,  Patient explained it was his understanding from our office that scheduling a screening colonoscopy would not leave him owing a balance.  Screenings were explained as being paid at 100 percent usually. The patient also had anesthesia expenses that were not covered in full.   Upon contacting his insurance, he was told that his claim could have been denied due to the procedure was scheduled at age 1 years when the policy only covers age 26 years for screening colonoscopy.  The patient states he did not understand why we would not know this since we provided a pre-certification on his procedure. He felt this should have been part of our review of his pre-certification and scheduling of procedure.   Upon review of the patient's chart, the procedure findings revealed polyps and this would change the procedure to a diagnostic exam due to these findings and removal of the polyps.  Explained to the patient that even though he came in for a screening, if the exam revealed polyps it would be viewed as diagnostic.  The patient states he accepted this explanation but still had concerns regarding why we scheduled an exam when his insurance parameters required him to be age 49 years. Discussed with patient that insurances vary in their terms and services and that we do not have full policy information when gathering pre-cert information.  The patient states the insurance carrier stated we should have known that his age was not covered.  The patient also had questions regarding several charges that came in under Dr. Doyne Keel services.  I contacted billing for more information but they preferred to speak with the patient in person.  Patient was given the phone number to billing  to discuss his charges and answers his questions.   Will follow up with patient within 2 days to see if his billing questions were ansered and share any further information from our office.

## 2019-10-23 NOTE — Telephone Encounter (Signed)
Contacted patient to follow up on billing concerns.  Patient was able to speak with the billing office and was told her would receive detailed billing info for his review.  He has not received that yet. Offered to follow up with patient next week and he agrees.

## 2019-10-28 NOTE — Telephone Encounter (Signed)
Patient is calling to follow up about below. He states that he did receive the detailed bill and he states that he has many questions and many complaints. He is requesting a call back from you.

## 2019-10-29 NOTE — Telephone Encounter (Signed)
Thanks for letting me know, I was not aware of any of this until now.  I don't think the patient's age should have anything to do with it. The exam was done 2 months prior to his 49th birthday, but updated screening guidelines earlier this year recommended starting screening at age 50.  If they changed the billing from screening to diagnostic exam due to polypectomy that is unfortunate and something that has been an issue nationally that is being addressed and out of our control. If there is any specific question I need to address otherwise let me know. Langley Gauss I would like to chat more with you about this. Thanks

## 2019-10-29 NOTE — Telephone Encounter (Signed)
Returned to call to Dr. [name] regarding billing concerns.  He acknowledged he did receive a detailed billing statement and he is concerned about conflicting information on charges.  He states he spoke with the billing dept again yesterday Alejandro Miller - he believes was her name) and was told his concerns would be escalated to her supervisor and he should receive a call back.   Dr. [name] explained that he has the understanding, from his own research, that he should not be billed a separate surgery charge for polypectomy during an initial screening procedure.  He would like someone to review his coding and billing and provide an explanation to some of his insurance billing/coding questions.  He requests to speak to the 'coding supervisor' and a 'billing supervisor' to better understand his charges and bills and have reassurance regarding his concerns.  Will refer this conversation to coding and begin discussion regarding review of complaint.  Will also notify anesthesia that patient is delaying paying this bill pending findings of procedure charges and coding.  Will provide update to patient Friday of this week regarding progress on review.  (May 21).   To include Dr. [name] as Alejandro Miller

## 2019-10-31 NOTE — Telephone Encounter (Signed)
Thank you Langley Gauss for keeping me in the loop and your help with this matter

## 2019-10-31 NOTE — Telephone Encounter (Signed)
Placed a follow up call to patient regarding his billing concerns.  Left voicemail to state coding was verified as correct per our coder but our Director was investing other details regarding his question about the ACA regarding surgical interventions during screening colonoscopy.  I also confirmed anesthesia received a message regarding there may be a delay in the patient paying his bill there until these concerns were addressed.  Anesthesia confirmed receipt of message.  And lastly, I mentioned Dr. Havery Moros offered to contact patient if this would in anyway be of help.  Dr. Havery Moros stated he would not be the expert on explaining the billing details but if he could be of any assistance to please let us know.  Patient directed to call our office number and could ask for myself or Dr. Havery Moros if he needed further contact.  Will follow up if no noted resolution in the next 7-10 days.

## 2019-12-09 NOTE — Telephone Encounter (Signed)
Received a voicemail today from patient and returned call.  Patient explained that he has received new EOB statements from a recent change in procedure coding and new statements show out-of-pocket expenses 'more than the previous' statements.  Patient is unclear regarding coding, charges and his understanding of how a screening colonoscopy can also have surgical charges included with his billing.  The patient states according to his research on screening colonoscopies, he believes the enactment of the ACA changed the way patients can be billed for additional services through screenings.    The patient has had several discussions with billing personnel in an attempt to express his concerns over charging and has not felt he has received a satisfactory explanation regarding how our services were billed.  Recoding has only increased his expenses.  He states when he scheduled his colonoscopy, it was his understanding that he should pay 'zero' from a screening colonoscopy, regardless of any biopsies or polyp removal.  He would like to express his concerns to someone who can fully explain how multiple 'surgical' charges were attached to his bill when he believes he should not be billed for additional biopsies, etc.  He referred to the AGA guidelines which he mentioned were 'the nationwide standards' for screening colonoscopies and he believes our charges are in error.  His understanding from scheduling a screening colonoscopy is that this is 'preventive' care and he should not incur expenses.    The patient would like to discuss with Dr. Havery Moros only to express his concerns that his bills may be incorrectly coded and to make the physician aware that he has not had an acceptable explanation of his charges.  I acknowledged the patient's concerns and will email our Director for assistance in locating someone who can better explain our charges item by item to the patient.  I will also let Dr. Havery Moros know that the  patient would like to talk with him regarding this concern.  The patient is also expecting a call from [facility] in billing this week.  He has already spoken with two different billing personnel but states the additional surgical charges are of concern to him as he doesn't understand how we can 'legally' charge for these items during a scheduled screening procedure.    I explained to the patient I would be out of the office this week but will return next week.  I will email my Director today to make her aware of this so she may assist in gathering some information on how to move forward with additional resources to assist the patient with a better understanding of recent coding and charging.    Closed the conversation by asking the patient to contact me if he has additional questions or he does not receive follow up over the next couple of weeks.

## 2019-12-10 NOTE — Telephone Encounter (Signed)
I called the patient and left a detailed message. I'm sorry he incurred any additional charges for his colonoscopy, I am not involved with the billing process however and unclear why this happened. I understand how he is frustrated by this but not sure where we go from here after review of Denise's message.   I will be out of the office the next few days but am happy to call him back when I return. Gavin Potters would you be able to call this patient and see if there are any other concerns you may be able to address for him? Thanks

## 2020-04-30 ENCOUNTER — Other Ambulatory Visit: Payer: Self-pay | Admitting: Registered Nurse

## 2020-04-30 ENCOUNTER — Telehealth: Payer: Self-pay | Admitting: Registered Nurse

## 2020-04-30 DIAGNOSIS — N529 Male erectile dysfunction, unspecified: Secondary | ICD-10-CM

## 2020-04-30 MED ORDER — SILDENAFIL CITRATE 20 MG PO TABS: 20.0000 mg | ORAL_TABLET | Freq: Three times a day (TID) | ORAL | 1 refills | Status: AC

## 2020-04-30 NOTE — Telephone Encounter (Signed)
Requested Prescriptions  Pending Prescriptions Disp Refills   sildenafil (REVATIO) 20 MG tablet 30 tablet 1    Sig: Take 1-2 tablets (20-40 mg total) by mouth 3 (three) times daily.     Urology: Erectile Dysfunction Agents Passed - 04/30/2020  3:43 PM      Passed - Last BP in normal range    BP Readings from Last 1 Encounters:  08/29/19 121/72         Passed - Valid encounter within last 12 months    Recent Outpatient Visits          8 months ago Annual physical exam   [facility], Delfino Lovett, NP   1 year ago Annual physical exam   [facility] [place], NP   2 years ago Annual physical exam   [facility] at [place], [place]   3 years ago Breast pain in male   [facility] at [place], [place]   3 years ago Breast pain, right   [facility] at [place], [place]

## 2020-04-30 NOTE — Telephone Encounter (Signed)
Copied from [place] [id number]. Topic: Quick Communication - Rx Refill/Question >> Apr 30, 2020  3:35 PM Leward Quan A wrote: Medication: sildenafil (REVATIO) 20 MG tablet   Has the patient contacted their pharmacy? Yes.   (Agent: If no, request that the patient contact the pharmacy for the refill.) (Agent: If yes, when and what did the pharmacy advise?) [id number] - [place], [place] - [address]  Phone:  [phone] Fax:  [phone]    Preferred Pharmacy (with phone number or street name):   Agent: Please be advised that RX refills may take up to 3 business days. We ask that you follow-up with your pharmacy.

## 2020-07-02 ENCOUNTER — Other Ambulatory Visit: Payer: Self-pay

## 2020-07-02 DIAGNOSIS — Z20822 Contact with and (suspected) exposure to covid-19: Secondary | ICD-10-CM

## 2020-07-04 LAB — NOVEL CORONAVIRUS, NAA: SARS-CoV-2, NAA: NOT DETECTED

## 2020-07-04 LAB — SARS-COV-2, NAA 2 DAY TAT

## 2020-12-20 ENCOUNTER — Other Ambulatory Visit: Payer: Self-pay | Admitting: Registered Nurse

## 2020-12-20 DIAGNOSIS — N529 Male erectile dysfunction, unspecified: Secondary | ICD-10-CM
